# Patient Record
Sex: Female | Born: 1960 | Race: White | Hispanic: No | Marital: Married | Smoking: Never smoker
Health system: Southern US, Community
[De-identification: ages and names within clinical notes are randomized; demographics above are authoritative.]

## PROBLEM LIST (undated history)

## (undated) DIAGNOSIS — D219 Benign neoplasm of connective and other soft tissue, unspecified: Secondary | ICD-10-CM

## (undated) DIAGNOSIS — E785 Hyperlipidemia, unspecified: Secondary | ICD-10-CM

## (undated) DIAGNOSIS — I00 Rheumatic fever without heart involvement: Secondary | ICD-10-CM

## (undated) DIAGNOSIS — K219 Gastro-esophageal reflux disease without esophagitis: Secondary | ICD-10-CM

## (undated) HISTORY — DX: Benign neoplasm of connective and other soft tissue, unspecified: D21.9

## (undated) HISTORY — DX: Gastro-esophageal reflux disease without esophagitis: K21.9

## (undated) HISTORY — DX: Rheumatic fever without heart involvement: I00

## (undated) HISTORY — DX: Hyperlipidemia, unspecified: E78.5

---

## 2001-03-02 ENCOUNTER — Encounter: Admission: RE | Admit: 2001-03-02 | Discharge: 2001-03-02 | Payer: Self-pay | Admitting: Obstetrics & Gynecology

## 2001-03-02 ENCOUNTER — Encounter: Payer: Self-pay | Admitting: Obstetrics & Gynecology

## 2002-03-20 ENCOUNTER — Encounter: Payer: Self-pay | Admitting: Family Medicine

## 2002-03-20 ENCOUNTER — Encounter: Admission: RE | Admit: 2002-03-20 | Discharge: 2002-03-20 | Payer: Self-pay | Admitting: Family Medicine

## 2003-04-25 ENCOUNTER — Encounter: Admission: RE | Admit: 2003-04-25 | Discharge: 2003-04-25 | Payer: Self-pay | Admitting: Obstetrics & Gynecology

## 2004-06-02 ENCOUNTER — Encounter: Admission: RE | Admit: 2004-06-02 | Discharge: 2004-06-02 | Payer: Self-pay | Admitting: Obstetrics & Gynecology

## 2005-06-24 ENCOUNTER — Encounter: Admission: RE | Admit: 2005-06-24 | Discharge: 2005-06-24 | Payer: Self-pay | Admitting: Obstetrics & Gynecology

## 2006-07-08 ENCOUNTER — Encounter: Admission: RE | Admit: 2006-07-08 | Discharge: 2006-07-08 | Payer: Self-pay | Admitting: Obstetrics & Gynecology

## 2007-08-17 ENCOUNTER — Encounter: Admission: RE | Admit: 2007-08-17 | Discharge: 2007-08-17 | Payer: Self-pay | Admitting: Internal Medicine

## 2008-04-04 ENCOUNTER — Ambulatory Visit: Payer: Self-pay | Admitting: Gastroenterology

## 2008-07-23 ENCOUNTER — Other Ambulatory Visit: Admission: RE | Admit: 2008-07-23 | Discharge: 2008-07-23 | Payer: Self-pay | Admitting: Obstetrics and Gynecology

## 2008-10-24 ENCOUNTER — Encounter: Admission: RE | Admit: 2008-10-24 | Discharge: 2008-10-24 | Payer: Self-pay | Admitting: Internal Medicine

## 2009-08-21 ENCOUNTER — Other Ambulatory Visit: Admission: RE | Admit: 2009-08-21 | Discharge: 2009-08-21 | Payer: Self-pay | Admitting: Obstetrics and Gynecology

## 2009-10-09 ENCOUNTER — Encounter: Admission: RE | Admit: 2009-10-09 | Discharge: 2009-10-09 | Payer: Self-pay | Admitting: Obstetrics and Gynecology

## 2010-10-27 ENCOUNTER — Other Ambulatory Visit: Payer: Self-pay | Admitting: Obstetrics & Gynecology

## 2010-10-27 DIAGNOSIS — Z1231 Encounter for screening mammogram for malignant neoplasm of breast: Secondary | ICD-10-CM

## 2010-11-03 ENCOUNTER — Ambulatory Visit
Admission: RE | Admit: 2010-11-03 | Discharge: 2010-11-03 | Disposition: A | Discharge status: Home / Self Care | Payer: Self-pay | Source: Ambulatory Visit | Attending: Obstetrics & Gynecology | Admitting: Obstetrics & Gynecology

## 2010-11-03 DIAGNOSIS — Z1231 Encounter for screening mammogram for malignant neoplasm of breast: Secondary | ICD-10-CM

## 2011-02-23 ENCOUNTER — Other Ambulatory Visit: Payer: Self-pay | Admitting: Internal Medicine

## 2011-09-27 LAB — HM MAMMOGRAPHY: HM Mammogram: NORMAL

## 2011-10-21 ENCOUNTER — Other Ambulatory Visit: Payer: Self-pay | Admitting: Obstetrics & Gynecology

## 2011-10-21 DIAGNOSIS — Z1231 Encounter for screening mammogram for malignant neoplasm of breast: Secondary | ICD-10-CM

## 2011-10-26 ENCOUNTER — Ambulatory Visit
Admission: RE | Admit: 2011-10-26 | Discharge: 2011-10-26 | Disposition: A | Discharge status: Home / Self Care | Payer: 59 | Source: Ambulatory Visit | Attending: Obstetrics & Gynecology | Admitting: Obstetrics & Gynecology

## 2011-10-26 DIAGNOSIS — Z1231 Encounter for screening mammogram for malignant neoplasm of breast: Secondary | ICD-10-CM

## 2012-07-27 ENCOUNTER — Ambulatory Visit (INDEPENDENT_AMBULATORY_CARE_PROVIDER_SITE_OTHER): Payer: BC Managed Care – PPO | Admitting: Internal Medicine

## 2012-07-27 ENCOUNTER — Encounter: Payer: Self-pay | Admitting: Internal Medicine

## 2012-07-27 VITALS — BP 104/80 | HR 80 | Temp 97.9°F | Resp 16 | Ht 64.25 in | Wt 131.5 lb

## 2012-07-27 DIAGNOSIS — Z0000 Encounter for general adult medical examination without abnormal findings: Secondary | ICD-10-CM

## 2012-07-27 DIAGNOSIS — E538 Deficiency of other specified B group vitamins: Secondary | ICD-10-CM

## 2012-07-27 MED ORDER — BACITRACIN-NEOMYCIN-POLYMYXIN 400-5-5000 EX OINT: TOPICAL_OINTMENT | Freq: Two times a day (BID) | CUTANEOUS | Status: DC

## 2012-07-27 MED ORDER — IBUPROFEN 200 MG PO TABS: 200.0000 mg | ORAL_TABLET | Freq: Four times a day (QID) | ORAL | Status: AC | PRN

## 2012-07-27 MED ORDER — SYRINGE (DISPOSABLE) 3 ML MISC: 1.0000 | Status: DC

## 2012-07-27 MED ORDER — CYANOCOBALAMIN 1000 MCG/ML IJ SOLN: 1000.0000 ug | INTRAMUSCULAR | Status: DC

## 2012-07-27 MED ORDER — IBUPROFEN 200 MG PO TABS: 200.0000 mg | ORAL_TABLET | Freq: Four times a day (QID) | ORAL | Status: DC | PRN

## 2012-07-27 NOTE — Patient Instructions (Addendum)
Great to see you!   

## 2012-07-27 NOTE — Progress Notes (Signed)
Patient ID: Lauren Bell, female   DOB: 1961-03-23, 52 y.o.   MRN: [id number]  Patient Active Problem List  Diagnosis  . Dietary B12 deficiency  . Routine general medical examination at a health care facility    Subjective:  CC:   Chief Complaint  Patient presents with  . Establish Care    HPI:   Lauren Bell is a 52 y.o. female who presents as a new patient to establish primary care with the chief complaint of Reestablish care.  She was last seen 3 years ago.  Since then she has happily  remarried to an old friend and has no chronic health issues except B12 deficiency.  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    Past Medical History  Diagnosis Date  . GERD (gastroesophageal reflux disease)   . Rheumatic fever   . Urinary tract infection     History reviewed. No pertinent past surgical history.  Family History  Problem Relation Age of Onset  . Cancer Mother     Adrenal  . Alcohol abuse Father   . Hyperlipidemia Father   . Hypertension Father   . Heart disease Maternal Grandfather   . Hypertension Maternal Grandfather     History   Social History  . Marital Status: Single    Spouse Name: N/A    Number of Children: N/A  . Years of Education: N/A   Occupational History  . Not on file.   Social History Main Topics  . Smoking status: Never Smoker   . Smokeless tobacco: Never Used  . Alcohol Use: No  . Drug Use: No  . Sexually Active: Yes   Other Topics Concern  . Not on file   Social History Narrative  . No narrative on file    No Known Allergies   Review of Systems:   Patient denies headache, fevers, malaise, unintentional weight loss, skin rash, eye pain, sinus congestion and sinus pain, sore throat, dysphagia,  hemoptysis , cough, dyspnea, wheezing, chest pain, palpitations, orthopnea, edema, abdominal pain, nausea, melena, diarrhea, constipation, flank pain, dysuria, hematuria, urinary  Frequency, nocturia,  numbness, tingling, seizures,  Focal weakness, Loss of consciousness,  Tremor, insomnia, depression, anxiety, and suicidal ideation.    Objective:  BP 104/80  Pulse 80  Temp(Src) 97.9 F (36.6 C) (Oral)  Resp 16  Ht 5' 4.25" (1.632 m)  Wt 131 lb 8 oz (59.648 kg)  BMI 22.4 kg/m2  SpO2 98%  LMP 06/23/2012  General appearance: alert, cooperative and appears stated age Ears: normal TM's and external ear canals both ears Throat: lips, mucosa, and tongue normal; teeth and gums normal Neck: no adenopathy, no carotid bruit, supple, symmetrical, trachea midline and thyroid not enlarged, symmetric, no tenderness/mass/nodules Back: symmetric, no curvature. ROM normal. No CVA tenderness. Lungs: clear to auscultation bilaterally Heart: regular rate and rhythm, S1, S2 normal, no murmur, click, rub or gallop Abdomen: soft, non-tender; bowel sounds normal; no masses,  no organomegaly Pulses: 2+ and symmetric Skin: Skin color, texture, turgor normal. No rashes or lesions Lymph nodes: Cervical, supraclavicular, and axillary nodes normal.  Assessment and Plan:  Dietary B12 deficiency She is requesting refill of  B12 injectable liquid and syringes.   Routine general medical examination at a health care facility Annual comprehensive exam was done excluding breast, pelvic and PAP smear. All screenings have been addressed and she is up to date   Updated Medication List Outpatient Encounter Prescriptions as of 07/27/2012  Medication  Sig Dispense Refill  . Multiple Vitamin (MULTIVITAMIN) tablet Take 1 tablet by mouth daily.      Marland Kitchen RESVERATROL PO Take 500mg  capsule by mouth daily      . cyanocobalamin (,VITAMIN B-12,) 1000 MCG/ML injection Inject 1 mL (1,000 mcg total) into the muscle every 21 ( twenty-one) days.  12 mL  11  . ibuprofen (MOTRIN IB) 200 MG tablet Take 1 tablet (200 mg total) by mouth every 6 (six) hours as needed for pain.  300 tablet  5  . neomycin-bacitracin-polymyxin (NEOSPORIN)  ointment Apply topically every 12 (twelve) hours.  15 g  11  . nitrofurantoin (MACRODANTIN) 100 MG capsule Take 100 mg by mouth 2 (two) times daily. For 7 days.      . Syringe, Disposable, 3 ML MISC 1 Syringe by Does not apply route every 21 ( twenty-one) days.  25 each  11  . [DISCONTINUED] cyanocobalamin (,VITAMIN B-12,) 1000 MCG/ML injection INJECT 1 ML EVERY MONTH  12 mL  0  . [DISCONTINUED] ibuprofen (MOTRIN IB) 200 MG tablet Take 1 tablet (200 mg total) by mouth every 6 (six) hours as needed for pain.  300 tablet  5   No facility-administered encounter medications on file as of 07/27/2012.     Orders Placed This Encounter  Procedures  . HM MAMMOGRAPHY  . HM PAP SMEAR  . HM COLONOSCOPY    No Follow-up on file.

## 2012-07-29 ENCOUNTER — Encounter: Payer: Self-pay | Admitting: Internal Medicine

## 2012-07-29 DIAGNOSIS — Z0000 Encounter for general adult medical examination without abnormal findings: Secondary | ICD-10-CM | POA: Insufficient documentation

## 2012-07-29 NOTE — Assessment & Plan Note (Signed)
She is requesting refill of  B12 injectable liquid and syringes.

## 2012-07-29 NOTE — Assessment & Plan Note (Signed)
Annual comprehensive exam was done excluding breast, pelvic and PAP smear. All screenings have been addressed and she is up to date

## 2012-10-13 ENCOUNTER — Other Ambulatory Visit: Payer: Self-pay

## 2012-10-13 DIAGNOSIS — Z1231 Encounter for screening mammogram for malignant neoplasm of breast: Secondary | ICD-10-CM

## 2012-11-01 ENCOUNTER — Encounter: Payer: Self-pay | Admitting: Obstetrics & Gynecology

## 2012-11-01 ENCOUNTER — Ambulatory Visit: Payer: Self-pay | Admitting: Obstetrics & Gynecology

## 2012-11-03 ENCOUNTER — Encounter: Payer: Self-pay | Admitting: Obstetrics & Gynecology

## 2012-11-03 ENCOUNTER — Ambulatory Visit (INDEPENDENT_AMBULATORY_CARE_PROVIDER_SITE_OTHER): Payer: BC Managed Care – PPO | Admitting: Obstetrics & Gynecology

## 2012-11-03 VITALS — BP 116/64 | HR 64 | Resp 16 | Ht 64.75 in | Wt 130.4 lb

## 2012-11-03 DIAGNOSIS — Z01419 Encounter for gynecological examination (general) (routine) without abnormal findings: Secondary | ICD-10-CM

## 2012-11-03 MED ORDER — NITROFURANTOIN MACROCRYSTAL 100 MG PO CAPS: 100.0000 mg | ORAL_CAPSULE | Freq: Every day | ORAL | Status: DC

## 2012-11-03 NOTE — Patient Instructions (Signed)

## 2012-11-03 NOTE — Progress Notes (Signed)
52 y.o. G0P0000 SingleCaucasianF here for annual exam.  Patient doing really well.  Cycles are irregular now.  Will skip months at a time.  Most cycles light now.  She has had an occasional "normal" one as well.  Did labs a year ago with PCP.  Dr. Darrick Huntsman.  Patient's last menstrual period was 09/25/2012.          Sexually active: yes  The current method of family planning is none.  Exercising: yes  cardio Smoker:  no  Health Maintenance: Pap:  10/05/11 WNL/negative HR HPV History of abnormal Pap:  no MMG:  10/27/11 normal, scheduled for yearly next week Colonoscopy:  2010 repeat in 10 years BMD:   none TDaP:  ? Screening Labs: PCP, Hb today: PCP, Urine today: PCP   reports that she has never smoked. She has never used smokeless tobacco. She reports that she does not drink alcohol or use illicit drugs.  Past Medical History  Diagnosis Date  . GERD (gastroesophageal reflux disease)   . Rheumatic fever   . Urinary tract infection   . Fibroids     h/o     History reviewed. No pertinent past surgical history.  Current Outpatient Prescriptions  Medication Sig Dispense Refill  . Calcium-Vitamin D-Vitamin K (CHEWABLE CALCIUM PO) Take by mouth.      . cyanocobalamin (,VITAMIN B-12,) 1000 MCG/ML injection Inject 1 mL (1,000 mcg total) into the muscle every 21 ( twenty-one) days.  12 mL  11  . ibuprofen (MOTRIN IB) 200 MG tablet Take 1 tablet (200 mg total) by mouth every 6 (six) hours as needed for pain.  300 tablet  5  . Multiple Vitamin (MULTIVITAMIN) tablet Take 1 tablet by mouth daily.      Marland Kitchen neomycin-bacitracin-polymyxin (NEOSPORIN) ointment Apply topically every 12 (twelve) hours.  15 g  11  . nitrofurantoin (MACRODANTIN) 100 MG capsule Take 100 mg by mouth 2 (two) times daily. For 7 days.      . RESVERATROL PO Take 500mg  capsule by mouth daily      . Syringe, Disposable, 3 ML MISC 1 Syringe by Does not apply route every 21 ( twenty-one) days.  25 each  11   No current  facility-administered medications for this visit.    Family History  Problem Relation Age of Onset  . Cancer Mother     Adrenal  . Alcohol abuse Father   . Hyperlipidemia Father   . Hypertension Father   . Heart disease Maternal Grandfather   . Hypertension Maternal Grandfather     ROS:  Pertinent items are noted in HPI.  Otherwise, a comprehensive ROS was negative.  Exam:   BP 116/64  Pulse 64  Resp 16  Ht 5' 4.75" (1.645 m)  Wt 130 lb 6.4 oz (59.149 kg)  BMI 21.86 kg/m2  LMP 09/25/2012  Weight change: +3  Height: 5' 4.75" (164.5 cm)  Ht Readings from Last 3 Encounters:  11/03/12 5' 4.75" (1.645 m)  07/27/12 5' 4.25" (1.632 m)    General appearance: alert, cooperative and appears stated age Head: Normocephalic, without obvious abnormality, atraumatic Neck: no adenopathy, supple, symmetrical, trachea midline and thyroid normal to inspection and palpation Lungs: clear to auscultation bilaterally Breasts: normal appearance, no masses or tenderness Heart: regular rate and rhythm Abdomen: soft, non-tender; bowel sounds normal; no masses,  no organomegaly Extremities: extremities normal, atraumatic, no cyanosis or edema Skin: Skin color, texture, turgor normal. No rashes or lesions Lymph nodes: Cervical, supraclavicular, and axillary nodes  normal. No abnormal inguinal nodes palpated Neurologic: Grossly normal   Pelvic: External genitalia:  no lesions              Urethra:  normal appearing urethra with no masses, tenderness or lesions              Bartholins and Skenes: normal                 Vagina: normal appearing vagina with normal color and discharge, no lesions              Cervix: no lesions              Pap taken: no Bimanual Exam:  Uterus:  enlarged, 8 weeks size and anteverted, globular              Adnexa: normal adnexa and no mass, fullness, tenderness               Rectovaginal: Confirms               Anus:  normal sphincter tone, no lesions  A:  Well  Woman with normal exam H/o fibroids H/O recurrent UTIs  P:   Mammogram yearly.  Will do 3D due to dense breasts Neg Pap with Neg HR HPV one year ago.  No pap today. Labs yearly with Dr. Darrick Huntsman Rx for [facility] #90/3RF given return annually or prn  An After Visit Summary was printed and given to the patient.

## 2012-11-10 ENCOUNTER — Ambulatory Visit
Admission: RE | Admit: 2012-11-10 | Discharge: 2012-11-10 | Disposition: A | Discharge status: Home / Self Care | Payer: BC Managed Care – PPO | Source: Ambulatory Visit

## 2012-11-10 DIAGNOSIS — Z1231 Encounter for screening mammogram for malignant neoplasm of breast: Secondary | ICD-10-CM

## 2013-03-22 ENCOUNTER — Other Ambulatory Visit: Payer: Self-pay

## 2013-07-13 ENCOUNTER — Other Ambulatory Visit: Payer: Self-pay | Admitting: *Deleted

## 2013-07-13 MED ORDER — CYANOCOBALAMIN 1000 MCG/ML IJ SOLN: 1000.0000 ug | INTRAMUSCULAR | Status: DC

## 2013-12-28 ENCOUNTER — Ambulatory Visit (INDEPENDENT_AMBULATORY_CARE_PROVIDER_SITE_OTHER): Payer: BC Managed Care – PPO | Admitting: Obstetrics & Gynecology

## 2013-12-28 ENCOUNTER — Encounter: Payer: Self-pay | Admitting: Obstetrics & Gynecology

## 2013-12-28 VITALS — BP 122/72 | HR 64 | Resp 16 | Ht 64.25 in | Wt 131.6 lb

## 2013-12-28 DIAGNOSIS — Z124 Encounter for screening for malignant neoplasm of cervix: Secondary | ICD-10-CM

## 2013-12-28 DIAGNOSIS — Z01419 Encounter for gynecological examination (general) (routine) without abnormal findings: Secondary | ICD-10-CM

## 2013-12-28 DIAGNOSIS — Z23 Encounter for immunization: Secondary | ICD-10-CM

## 2013-12-28 MED ORDER — NITROFURANTOIN MACROCRYSTAL 100 MG PO CAPS: 100.0000 mg | ORAL_CAPSULE | Freq: Every day | ORAL | Status: DC

## 2013-12-28 NOTE — Progress Notes (Signed)
53 y.o. G0P0000 Married CaucasianF here for annual exam.  Cycles are infrequent.  Had three this year--sept, march, and April.  None were heavy.  No abnormal spotting.    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    Patient's last menstrual period was 09/08/2013.          Sexually active: Yes.    The current method of family planning is none.    Exercising: Yes.    [place] Smoker:  no  Health Maintenance: Pap:  10/05/11 WNL/negative HR HPV History of abnormal Pap:  no MMG:  11/10/12 3D-normal.  Aware it is due. Colonoscopy:  2010-repeat in 10 years BMD:   none TDaP:  ? Screening Labs: PCP, Hb today: PCP, Urine today: PCP   reports that she has never smoked. She has never used smokeless tobacco. She reports that she does not drink alcohol or use illicit drugs.  Past Medical History  Diagnosis Date  . GERD (gastroesophageal reflux disease)   . Rheumatic fever   . Fibroids     h/o     History reviewed. No pertinent past surgical history.  Current Outpatient Prescriptions  Medication Sig Dispense Refill  . Multiple Vitamin (MULTIVITAMIN) tablet Take 1 tablet by mouth daily.      Marland Kitchen RESVERATROL PO Take 500mg  capsule by mouth daily      . Calcium-Vitamin D-Vitamin K (CHEWABLE CALCIUM PO) Take by mouth.      . cyanocobalamin (,VITAMIN B-12,) 1000 MCG/ML injection Inject 1 mL (1,000 mcg total) into the muscle every 21 ( twenty-one) days.  1 mL  1  . ibuprofen (MOTRIN IB) 200 MG tablet Take 1 tablet (200 mg total) by mouth every 6 (six) hours as needed for pain.  300 tablet  5  . nitrofurantoin (MACRODANTIN) 100 MG capsule Take 1 capsule (100 mg total) by mouth daily. For 7 days.  90 capsule  3  . Syringe, Disposable, 3 ML MISC 1 Syringe by Does not apply route every 21 ( twenty-one) days.  25 each  11   No current facility-administered medications for this visit.    Family History  Problem Relation Age of Onset  . Cancer Mother     Adrenal  . Alcohol abuse  Father   . Hyperlipidemia Father   . Hypertension Father   . Heart disease Maternal Grandfather   . Hypertension Maternal Grandfather     ROS:  Pertinent items are noted in HPI.  Otherwise, a comprehensive ROS was negative.  Exam:   BP 122/72  Pulse 64  Resp 16  Ht 5' 4.25" (1.632 m)  Wt 131 lb 9.6 oz (59.693 kg)  BMI 22.41 kg/m2  LMP 09/08/2013  Height: 5' 4.25" (163.2 cm)  Ht Readings from Last 3 Encounters:  12/28/13 5' 4.25" (1.632 m)  11/03/12 5' 4.75" (1.645 m)  07/27/12 5' 4.25" (1.632 m)    General appearance: alert, cooperative and appears stated age Head: Normocephalic, without obvious abnormality, atraumatic Neck: no adenopathy, supple, symmetrical, trachea midline and thyroid normal to inspection and palpation Lungs: clear to auscultation bilaterally Breasts: normal appearance, no masses or tenderness Heart: regular rate and rhythm Abdomen: soft, non-tender; bowel sounds normal; no masses,  no organomegaly Extremities: extremities normal, atraumatic, no cyanosis or edema Skin: Skin color, texture, turgor normal. No rashes or lesions Lymph nodes: Cervical, supraclavicular, and axillary nodes normal. No abnormal inguinal nodes palpated Neurologic: Grossly normal   Pelvic: External genitalia:  no lesions  Urethra:  normal appearing urethra with no masses, tenderness or lesions              Bartholins and Skenes: normal                 Vagina: normal appearing vagina with normal color and discharge, no lesions              Cervix: no lesions              Pap taken: Yes.   Bimanual Exam:  Uterus:  normal size, contour, position, consistency, mobility, non-tender              Adnexa: normal adnexa and no mass, fullness, tenderness               Rectovaginal: Confirms               Anus:  normal sphincter tone, no lesions  A:  Well Woman with normal exam  H/o fibroids last PUS 6/12 with 3.2cm fibroid H/O recurrent UTIs   P: Mammogram yearly. Will  do 3D due to dense breasts  Neg Pap with Neg HR HPV one year ago.  Pap today.  Labs yearly with Dr. Derrel Nip.  Hasn't seen her this year.  Goes about every other year.  May need RF which I told her I can do.  1023mcg/ml every 21-28 days. Rx for Macrobid #30/3RF given  tdap given today. return annually or prn   An After Visit Summary was printed and given to the patient.

## 2014-01-02 LAB — IPS PAP TEST WITH REFLEX TO HPV

## 2014-03-28 ENCOUNTER — Other Ambulatory Visit: Payer: Self-pay

## 2014-03-28 DIAGNOSIS — Z1231 Encounter for screening mammogram for malignant neoplasm of breast: Secondary | ICD-10-CM

## 2014-04-18 ENCOUNTER — Ambulatory Visit
Admission: RE | Admit: 2014-04-18 | Discharge: 2014-04-18 | Disposition: A | Discharge status: Home / Self Care | Payer: BC Managed Care – PPO | Source: Ambulatory Visit

## 2014-04-18 DIAGNOSIS — Z1231 Encounter for screening mammogram for malignant neoplasm of breast: Secondary | ICD-10-CM

## 2014-05-15 ENCOUNTER — Encounter: Payer: Self-pay | Admitting: Internal Medicine

## 2014-05-15 MED ORDER — CYANOCOBALAMIN 1000 MCG/ML IJ SOLN: 1000.0000 ug | INTRAMUSCULAR | Status: DC

## 2014-05-15 NOTE — Telephone Encounter (Signed)
See mychart, Ok refill, last visit 07/27/12

## 2014-09-18 ENCOUNTER — Encounter: Payer: Self-pay | Admitting: Internal Medicine

## 2014-12-10 ENCOUNTER — Ambulatory Visit (INDEPENDENT_AMBULATORY_CARE_PROVIDER_SITE_OTHER): Payer: Managed Care, Other (non HMO) | Admitting: Internal Medicine

## 2014-12-10 ENCOUNTER — Encounter: Payer: Self-pay | Admitting: Internal Medicine

## 2014-12-10 VITALS — BP 136/74 | HR 71 | Temp 98.0°F | Resp 14 | Ht 64.0 in | Wt 132.5 lb

## 2014-12-10 DIAGNOSIS — R5383 Other fatigue: Secondary | ICD-10-CM | POA: Diagnosis not present

## 2014-12-10 DIAGNOSIS — Z0000 Encounter for general adult medical examination without abnormal findings: Secondary | ICD-10-CM

## 2014-12-10 DIAGNOSIS — E785 Hyperlipidemia, unspecified: Secondary | ICD-10-CM

## 2014-12-10 DIAGNOSIS — E559 Vitamin D deficiency, unspecified: Secondary | ICD-10-CM

## 2014-12-10 DIAGNOSIS — Z1159 Encounter for screening for other viral diseases: Secondary | ICD-10-CM

## 2014-12-10 DIAGNOSIS — Z1239 Encounter for other screening for malignant neoplasm of breast: Secondary | ICD-10-CM

## 2014-12-10 MED ORDER — CYANOCOBALAMIN 1000 MCG/ML IJ SOLN: 1000.0000 ug | INTRAMUSCULAR | Status: DC

## 2014-12-10 MED ORDER — SYRINGE (DISPOSABLE) 3 ML MISC: 1.0000 | Status: DC

## 2014-12-10 NOTE — Patient Instructions (Signed)

## 2014-12-10 NOTE — Progress Notes (Signed)
Pre-visit discussion using our clinic review tool. No additional management support is needed unless otherwise documented below in the visit note.  

## 2014-12-11 LAB — CBC WITH DIFFERENTIAL/PLATELET
BASOS ABS: 0 10*3/uL (ref 0.0–0.1)
BASOS PCT: 0.2 % (ref 0.0–3.0)
Eosinophils Absolute: 0.1 10*3/uL (ref 0.0–0.7)
Eosinophils Relative: 1.2 % (ref 0.0–5.0)
HEMATOCRIT: 42.3 % (ref 36.0–46.0)
Hemoglobin: 14.6 g/dL (ref 12.0–15.0)
LYMPHS PCT: 30.7 % (ref 12.0–46.0)
Lymphs Abs: 2.1 10*3/uL (ref 0.7–4.0)
MCHC: 34.5 g/dL (ref 30.0–36.0)
MCV: 90.8 fl (ref 78.0–100.0)
Monocytes Absolute: 0.5 10*3/uL (ref 0.1–1.0)
Monocytes Relative: 7.8 % (ref 3.0–12.0)
NEUTROS PCT: 60.1 % (ref 43.0–77.0)
Neutro Abs: 4.2 10*3/uL (ref 1.4–7.7)
PLATELETS: 245 10*3/uL (ref 150.0–400.0)
RBC: 4.66 Mil/uL (ref 3.87–5.11)
RDW: 13.3 % (ref 11.5–15.5)
WBC: 6.9 10*3/uL (ref 4.0–10.5)

## 2014-12-11 LAB — COMPREHENSIVE METABOLIC PANEL
ALBUMIN: 4.7 g/dL (ref 3.5–5.2)
ALT: 15 U/L (ref 0–35)
AST: 20 U/L (ref 0–37)
Alkaline Phosphatase: 74 U/L (ref 39–117)
BUN: 8 mg/dL (ref 6–23)
CALCIUM: 9.8 mg/dL (ref 8.4–10.5)
CHLORIDE: 104 meq/L (ref 96–112)
CO2: 30 mEq/L (ref 19–32)
Creatinine, Ser: 0.73 mg/dL (ref 0.40–1.20)
GFR: 88.17 mL/min (ref >60.00)
GLUCOSE: 70 mg/dL (ref 70–99)
Potassium: 4.6 mEq/L (ref 3.5–5.1)
Sodium: 142 mEq/L (ref 135–145)
Total Bilirubin: 0.4 mg/dL (ref 0.2–1.2)
Total Protein: 7.1 g/dL (ref 6.0–8.3)

## 2014-12-11 LAB — TSH: TSH: 1.04 u[IU]/mL (ref 0.35–4.50)

## 2014-12-11 LAB — VITAMIN D 25 HYDROXY (VIT D DEFICIENCY, FRACTURES): VITD: 25.89 ng/mL — AB (ref 30.00–100.00)

## 2014-12-11 LAB — LIPID PANEL
CHOL/HDL RATIO: 3
Cholesterol: 227 mg/dL — ABNORMAL HIGH (ref 0–200)
HDL: 83.8 mg/dL (ref >39.00)
LDL CALC: 116 mg/dL — AB (ref 0–99)
NonHDL: 143.2
Triglycerides: 135 mg/dL (ref 0.0–149.0)
VLDL: 27 mg/dL (ref 0.0–40.0)

## 2014-12-11 LAB — HEPATITIS C ANTIBODY: HCV AB: NEGATIVE

## 2014-12-12 ENCOUNTER — Encounter: Payer: Self-pay | Admitting: Internal Medicine

## 2014-12-12 DIAGNOSIS — E559 Vitamin D deficiency, unspecified: Secondary | ICD-10-CM | POA: Insufficient documentation

## 2014-12-12 DIAGNOSIS — E785 Hyperlipidemia, unspecified: Secondary | ICD-10-CM | POA: Insufficient documentation

## 2014-12-12 NOTE — Assessment & Plan Note (Signed)
Annual wellness  exam was done as well as a comprehensive physical exam  .  During the course of the visit the patient was educated and counseled about appropriate screening and preventive services and screenings were brought up to date for cervical and breast cancer .  She has had fasting labs to provide samples for diabetes screening and lipid analysis with projected  10 year  risk for CAD. nutrition counseling, skin cancer screening has been recommended, along with review of the age appropriate recommended immunizations.  Printed recommendations for health maintenance screenings was given.

## 2014-12-12 NOTE — Progress Notes (Signed)
Patient ID: Lauren Bell, female    DOB: 1960/09/16  Age: 54 y.o. MRN: [id number]  The patient is here for annual  wellness examination and management of other chronic and acute problems.   The risk factors are reflected in the social history.  The roster of all physicians providing medical care to patient - is listed in the Snapshot section of the chart.  Activities of daily living:  The patient is 100% independent in all ADLs: dressing, toileting, feeding as well as independent mobility  Home safety : The patient has smoke detectors in the home. They wear seatbelts.  There are no firearms at home. There is no violence in the home.   There is no risks for hepatitis, STDs or HIV. There is no   history of blood transfusion. They have no travel history to infectious disease endemic areas of the world.  The patient has seen their dentist in the last six month. They have seen their eye doctor in the last year. They admit to slight hearing difficulty with regard to whispered voices and some television programs.  They have deferred audiologic testing in the last year.  They do not  have excessive sun exposure. Discussed the need for sun protection: hats, long sleeves and use of sunscreen if there is significant sun exposure.   Diet: the importance of a healthy diet is discussed. They do have a healthy diet.  The benefits of regular aerobic exercise were discussed. She exercises 5 gtimes per week a minimum of 30 minutes.   Depression screen: there are no signs or vegative symptoms of depression- irritability, change in appetite, anhedonia, sadness/tearfullness.  Cognitive assessment: the patient manages all their financial and personal affairs and is actively engaged. They could relate day,date,year and events; recalled 2/3 objects at 3 minutes; performed clock-face test normally.  The following portions of the patient's history were reviewed and updated as appropriate: allergies, current  medications, past family history, past medical history,  past surgical history, past social history  and problem list.  Visual acuity was not assessed per patient preference since she has regular follow up with her ophthalmologist. Hearing and body mass index were assessed and reviewed.   During the course of the visit the patient was educated and counseled about appropriate screening and preventive services including : fall prevention , diabetes screening, nutrition counseling, colorectal cancer screening, and recommended immunizations.    CC: The primary encounter diagnosis was Breast cancer screening. Diagnoses of Other fatigue, Hyperlipidemia, Need for hepatitis C screening test, Vitamin D deficiency, Hyperlipidemia LDL goal <160, and Routine general medical examination at a health care facility were also pertinent to this visit.  History Lauren Bell has a past medical history of GERD (gastroesophageal reflux disease); Rheumatic fever; and Fibroids.   She has no past surgical history on file.   Her family history includes Alcohol abuse in her father; Cancer in her mother; Heart disease in her maternal grandfather; Hyperlipidemia in her father; Hypertension in her father and maternal grandfather.She reports that she has never smoked. She has never used smokeless tobacco. She reports that she does not drink alcohol or use illicit drugs.  Outpatient Prescriptions Prior to Visit  Medication Sig Dispense Refill  . ibuprofen (MOTRIN IB) 200 MG tablet Take 1 tablet (200 mg total) by mouth every 6 (six) hours as needed for pain. 300 tablet 5  . RESVERATROL PO Take 500mg  capsule by mouth daily    . cyanocobalamin (,VITAMIN B-12,) 1000 MCG/ML injection Inject 1  mL (1,000 mcg total) into the muscle every 7 (seven) days. 10 mL 3  . Syringe, Disposable, 3 ML MISC 1 Syringe by Does not apply route every 21 ( twenty-one) days. 25 each 11  . Calcium-Vitamin D-Vitamin K (CHEWABLE CALCIUM PO) Take by mouth.     . Multiple Vitamin (MULTIVITAMIN) tablet Take 1 tablet by mouth daily.    . nitrofurantoin (MACRODANTIN) 100 MG capsule Take 1 capsule (100 mg total) by mouth daily. For 7 days.  Also may use for prophylaxis. (Patient not taking: Reported on 12/10/2014) 30 capsule 12   No facility-administered medications prior to visit.    Review of Systems   Patient denies headache, fevers, malaise, unintentional weight loss, skin rash, eye pain, sinus congestion and sinus pain, sore throat, dysphagia,  hemoptysis , cough, dyspnea, wheezing, chest pain, palpitations, orthopnea, edema, abdominal pain, nausea, melena, diarrhea, constipation, flank pain, dysuria, hematuria, urinary  Frequency, nocturia, numbness, tingling, seizures,  Focal weakness, Loss of consciousness,  Tremor, insomnia, depression, anxiety, and suicidal ideation.     Objective:  BP 136/74 mmHg  Pulse 71  Temp(Src) 98 F (36.7 C) (Oral)  Resp 14  Ht 5\' 4"  (1.626 m)  Wt 132 lb 8 oz (60.102 kg)  BMI 22.73 kg/m2  SpO2 97%  LMP 09/09/2014  Physical Exam   General appearance: alert, cooperative and appears stated age Head: Normocephalic, without obvious abnormality, atraumatic Eyes: conjunctivae/corneas clear. PERRL, EOM's intact. Fundi benign. Ears: normal TM's and external ear canals both ears Nose: Nares normal. Septum midline. Mucosa normal. No drainage or sinus tenderness. Throat: lips, mucosa, and tongue normal; teeth and gums normal Neck: no adenopathy, no carotid bruit, no JVD, supple, symmetrical, trachea midline and thyroid not enlarged, symmetric, no tenderness/mass/nodules Lungs: clear to auscultation bilaterally Breasts: normal appearance, no masses or tenderness Heart: regular rate and rhythm, S1, S2 normal, no murmur, click, rub or gallop Abdomen: soft, non-tender; bowel sounds normal; no masses,  no organomegaly Extremities: extremities normal, atraumatic, no cyanosis or edema Pulses: 2+ and symmetric Skin: Skin  color, texture, turgor normal. No rashes or lesions Neurologic: Alert and oriented X 3, normal strength and tone. Normal symmetric reflexes. Normal coordination and gait.    Assessment & Plan:   Problem List Items Addressed This Visit      Unprioritized   Routine general medical examination at a health care facility    Annual wellness  exam was done as well as a comprehensive physical exam  .  During the course of the visit the patient was educated and counseled about appropriate screening and preventive services and screenings were brought up to date for cervical and breast cancer .  She has had fasting labs to provide samples for diabetes screening and lipid analysis with projected  10 year  risk for CAD. nutrition counseling, skin cancer screening has been recommended, along with review of the age appropriate recommended immunizations.  Printed recommendations for health maintenance screenings was given.        Hyperlipidemia LDL goal <160    Based on current lipid profile, the risk of clinically significant CAD is 5.4 over the next 10 years, using the Framingham risk calculator. The [employer] recommends starting patients aged 10 or higher on moderate intensity statin therapy for LDL between 70-189 and 10 yr risk of CAD > 7.5% ;  and high intensity therapy for anyone with LDL > 190.  There is no indication for statin therapy at this time.  Vitamin D deficiency    Mild , level was 25.  Advised to increase dose to 2000 IUs daily       Relevant Orders   Vit D  25 hydroxy (rtn osteoporosis monitoring) (Completed)    Other Visit Diagnoses    Breast cancer screening    -  Primary    Relevant Orders    MM DIGITAL SCREENING BILATERAL    Other fatigue        Relevant Orders    CBC with Differential/Platelet (Completed)    Comprehensive metabolic panel (Completed)    TSH (Completed)    Hyperlipidemia        Relevant Orders    Lipid panel (Completed)    Need for  hepatitis C screening test        Relevant Orders    Hepatitis C antibody (Completed)       I am having Ms. Lauren Bell maintain her multivitamin, RESVERATROL PO, ibuprofen, Calcium-Vitamin D-Vitamin K (CHEWABLE CALCIUM PO), nitrofurantoin, Probiotic Product (PROBIOTIC & ACIDOPHILUS EX ST PO), cyanocobalamin, and Syringe (Disposable).  Meds ordered this encounter  Medications  . Probiotic Product (PROBIOTIC & ACIDOPHILUS EX ST PO)    Sig: Take 1 capsule by mouth daily.  Marland Kitchen DISCONTD: cyanocobalamin (,VITAMIN B-12,) 1000 MCG/ML injection    Sig: Inject 1 mL (1,000 mcg total) into the muscle every 7 (seven) days.    Dispense:  10 mL    Refill:  3  . DISCONTD: Syringe, Disposable, 3 ML MISC    Sig: 1 Syringe by Does not apply route every 21 ( twenty-one) days.    Dispense:  25 each    Refill:  11  . cyanocobalamin (,VITAMIN B-12,) 1000 MCG/ML injection    Sig: Inject 1 mL (1,000 mcg total) into the muscle every 7 (seven) days.    Dispense:  10 mL    Refill:  3  . Syringe, Disposable, 3 ML MISC    Sig: 1 Syringe by Does not apply route every 21 ( twenty-one) days.    Dispense:  25 each    Refill:  11    Medications Discontinued During This Encounter  Medication Reason  . cyanocobalamin (,VITAMIN B-12,) 1000 MCG/ML injection Reorder  . Syringe, Disposable, 3 ML MISC Reorder  . cyanocobalamin (,VITAMIN B-12,) 1000 MCG/ML injection Reorder  . Syringe, Disposable, 3 ML MISC Reorder    Follow-up: No Follow-up on file.   Crecencio Mc, MD

## 2014-12-12 NOTE — Assessment & Plan Note (Signed)
Based on current lipid profile, the risk of clinically significant CAD is 5.4 over the next 10 years, using the Framingham risk calculator. The [employer] recommends starting patients aged 54 or higher on moderate intensity statin therapy for LDL between 70-189 and 10 yr risk of CAD > 7.5% ;  and high intensity therapy for anyone with LDL > 190.  There is no indication for statin therapy at this time.

## 2014-12-12 NOTE — Assessment & Plan Note (Signed)
Mild , level was 25.  Advised to increase dose to 2000 IUs daily

## 2015-02-06 ENCOUNTER — Ambulatory Visit: Payer: BC Managed Care – PPO | Admitting: Obstetrics & Gynecology

## 2015-02-19 ENCOUNTER — Ambulatory Visit (INDEPENDENT_AMBULATORY_CARE_PROVIDER_SITE_OTHER): Payer: Managed Care, Other (non HMO) | Admitting: Obstetrics and Gynecology

## 2015-02-19 ENCOUNTER — Encounter: Payer: Self-pay | Admitting: Obstetrics and Gynecology

## 2015-02-19 VITALS — BP 112/66 | HR 72 | Resp 16 | Ht 64.25 in | Wt 133.0 lb

## 2015-02-19 DIAGNOSIS — Z87440 Personal history of urinary (tract) infections: Secondary | ICD-10-CM | POA: Diagnosis not present

## 2015-02-19 DIAGNOSIS — Z01419 Encounter for gynecological examination (general) (routine) without abnormal findings: Secondary | ICD-10-CM | POA: Diagnosis not present

## 2015-02-19 MED ORDER — NITROFURANTOIN MACROCRYSTAL 100 MG PO CAPS: ORAL_CAPSULE | ORAL | Status: DC

## 2015-02-19 NOTE — Patient Instructions (Signed)

## 2015-02-19 NOTE — Progress Notes (Signed)
Patient ID: Lauren Bell, female   DOB: May 09, 1961, 54 y.o.   MRN: [id number] 54 y.o. G0P0000 SingleCaucasianF here for annual exam.  The patient has a risk of recurrent UTI's, for a while she was taking Macrobid with intercourse, now using intermittently with intercourse. LMP in 5/16, over the last 3 years, irregular, 3 in the prior year. She is having hot flashes, night sweats and vaginal dryness. Symptoms are tolerable. Uses lubricant with intercourse, no pain. No spotting since 5/16.     Patient's last menstrual period was 10/11/2014 (exact date).          Sexually active: Yes.    The current method of family planning is none.    Exercising: Yes.    kayaking, paddle boarding, walking Smoker:  no  Health Maintenance: Pap:  12/28/13, Negative History of abnormal Pap:  no MMG:  04/18/14, Bi-Rads 1:  Negative Colonoscopy:  07/27/08, Normal diagnostic at age 53 BMD:   Never  TDaP:  12/28/13 Gardasil: N/A   reports that she has never smoked. She has never used smokeless tobacco. She reports that she does not drink alcohol or use illicit drugs.  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  Past Medical History  Diagnosis Date  . GERD (gastroesophageal reflux disease)   . Rheumatic fever   . Fibroids     h/o     History reviewed. No pertinent past surgical history.  Current Outpatient Prescriptions  Medication Sig Dispense Refill  . Calcium-Vitamin D-Vitamin K (CHEWABLE CALCIUM PO) Take by mouth.    . cyanocobalamin (,VITAMIN B-12,) 1000 MCG/ML injection Inject 1 mL (1,000 mcg total) into the muscle every 7 (seven) days. 10 mL 3  . ibuprofen (MOTRIN IB) 200 MG tablet Take 1 tablet (200 mg total) by mouth every 6 (six) hours as needed for pain. 300 tablet 5  . Multiple Vitamin (MULTIVITAMIN) tablet Take 1 tablet by mouth daily.    . nitrofurantoin (MACRODANTIN) 100 MG capsule Take 1 capsule (100 mg total) by mouth daily. For 7 days.  Also may use for prophylaxis. 30 capsule 12  .  Probiotic Product (PROBIOTIC & ACIDOPHILUS EX ST PO) Take 1 capsule by mouth daily.    Marland Kitchen RESVERATROL PO Take 500mg  capsule by mouth daily    . Syringe, Disposable, 3 ML MISC 1 Syringe by Does not apply route every 21 ( twenty-one) days. 25 each 11   No current facility-administered medications for this visit.    Family History  Problem Relation Age of Onset  . Cancer Mother     Adrenal  . Alcohol abuse Father   . Hyperlipidemia Father   . Hypertension Father   . Heart disease Maternal Grandfather   . Hypertension Maternal Grandfather     Review of Systems  Exam:   BP 112/66 mmHg  Pulse 72  Resp 16  Ht 5' 4.25" (1.632 m)  Wt 133 lb (60.328 kg)  BMI 22.65 kg/m2  LMP 10/11/2014 (Exact Date)  Weight change: @WEIGHTCHANGE @ Height:   Height: 5' 4.25" (163.2 cm)  Ht Readings from Last 3 Encounters:  02/19/15 5' 4.25" (1.632 m)  12/10/14 5\' 4"  (1.626 m)  12/28/13 5' 4.25" (1.632 m)    General appearance: alert, cooperative and appears stated age Head: Normocephalic, without obvious abnormality, atraumatic Neck: no adenopathy, supple, symmetrical, trachea midline and thyroid normal to inspection and palpation Lungs: clear to auscultation bilaterally Breasts: normal appearance, no masses or tenderness Heart: regular rate and rhythm Abdomen: soft, non-tender;  bowel sounds normal; no masses,  no organomegaly Extremities: extremities normal, atraumatic, no cyanosis or edema Skin: Skin color, texture, turgor normal. No rashes or lesions Lymph nodes: Cervical, supraclavicular, and axillary nodes normal. No abnormal inguinal nodes palpated Neurologic: Grossly normal   Pelvic: External genitalia:  no lesions              Urethra:  normal appearing urethra with no masses, tenderness or lesions              Bartholins and Skenes: normal                 Vagina: normal appearing vagina with normal color and discharge, no lesions. Mild atrophy              Cervix: no lesions                Bimanual Exam:  Uterus:  normal size, contour, position, consistency, mobility, non-tender and anteverted              Adnexa: no mass, fullness, tenderness               Rectovaginal: Confirms               Anus:  normal sphincter tone, no lesions  Chaperone was present for exam.  A:  Well Woman with normal exam  H/O UTI's with intercourse, uses prophylaxis  P:    Labs with primary  Pap not due  Mammogram due in 12/16  Colonoscopy in 2020  Discussed breast self exam  Calcium and vit D  Macrobid prn intercourse

## 2015-06-18 ENCOUNTER — Ambulatory Visit
Admission: RE | Admit: 2015-06-18 | Discharge: 2015-06-18 | Disposition: A | Discharge status: Home / Self Care | Payer: Managed Care, Other (non HMO) | Source: Ambulatory Visit | Attending: Internal Medicine | Admitting: Internal Medicine

## 2015-06-18 DIAGNOSIS — Z1239 Encounter for other screening for malignant neoplasm of breast: Secondary | ICD-10-CM

## 2016-03-05 ENCOUNTER — Encounter: Payer: Self-pay | Admitting: Obstetrics & Gynecology

## 2016-03-12 ENCOUNTER — Telehealth: Payer: Self-pay | Admitting: *Deleted

## 2016-03-12 NOTE — Telephone Encounter (Signed)
Spoke with Coralyn Mark at [facility]. Provided fax [phone]. Advised will send orders via fax.

## 2016-03-12 NOTE — Telephone Encounter (Signed)
Order faxed to [facility]. Will notify patient via [place].   Routing to provider for final review. Patient is agreeable to disposition. Will close encounter.

## 2016-03-12 NOTE — Telephone Encounter (Signed)
Called [facility] [phone], Left message to call Sharee Pimple at [facility] [phone].

## 2016-03-12 NOTE — Telephone Encounter (Signed)
Spoke with patient. Patient provided RN with telephone # for [facility] [phone]. Advised patient, RN would call to see if fax # available for faxing order and return call to patient. Patient agreeable.

## 2016-03-12 NOTE — Telephone Encounter (Signed)
Return call to patient. Advised no answer at [facility], left message to return call. Advised patient if no return call from [facility] today, will send order out via standard mail today 03/12/16. Patient verbalizes understanding and is agreeable.

## 2016-03-23 ENCOUNTER — Telehealth: Payer: Self-pay | Admitting: Obstetrics & Gynecology

## 2016-03-23 ENCOUNTER — Ambulatory Visit: Payer: Self-pay | Admitting: Obstetrics & Gynecology

## 2016-03-23 NOTE — Telephone Encounter (Signed)
Left patient a message to call back to reschedule an appointment for her AEX today that was cancelled by the provider.

## 2016-03-23 NOTE — Progress Notes (Deleted)
55 y.o. G0P0000 SingleCaucasianF here for annual exam.    No LMP recorded.          Sexually active: {yes no:314532}  The current method of family planning is none.    Exercising: {yes no:314532}  {types:19826} Smoker:  no  Health Maintenance: Pap:  12/28/13 negative  History of abnormal Pap:  no MMG:  06/19/15 BIRADS 1 negative  Colonoscopy:  07/27/08 normal  BMD:   never TDaP:  12/28/13 Pneumonia vaccine(s):  *** Zostavax:   *** Hep C testing: *** Screening Labs: ***, Hb today: ***, Urine today: ***   reports that she has never smoked. She has never used smokeless tobacco. She reports that she does not drink alcohol or use drugs.  Past Medical History:  Diagnosis Date  . Fibroids    h/o   . GERD (gastroesophageal reflux disease)   . Rheumatic fever     No past surgical history on file.  Current Outpatient Prescriptions  Medication Sig Dispense Refill  . Calcium-Vitamin D-Vitamin K (CHEWABLE CALCIUM PO) Take by mouth.    . cyanocobalamin (,VITAMIN B-12,) 1000 MCG/ML injection Inject 1 mL (1,000 mcg total) into the muscle every 7 (seven) days. 10 mL 3  . ibuprofen (MOTRIN IB) 200 MG tablet Take 1 tablet (200 mg total) by mouth every 6 (six) hours as needed for pain. 300 tablet 5  . Multiple Vitamin (MULTIVITAMIN) tablet Take 1 tablet by mouth daily.    . nitrofurantoin (MACRODANTIN) 100 MG capsule 1 tab po with intercourse 30 capsule 3  . Probiotic Product (PROBIOTIC & ACIDOPHILUS EX ST PO) Take 1 capsule by mouth daily.    Marland Kitchen RESVERATROL PO Take 500mg  capsule by mouth daily    . Syringe, Disposable, 3 ML MISC 1 Syringe by Does not apply route every 21 ( twenty-one) days. 25 each 11   No current facility-administered medications for this visit.     Family History  Problem Relation Age of Onset  . Cancer Mother     Adrenal  . Alcohol abuse Father   . Hyperlipidemia Father   . Hypertension Father   . Heart disease Maternal Grandfather   . Hypertension Maternal  Grandfather     ROS:  Pertinent items are noted in HPI.  Otherwise, a comprehensive ROS was negative.  Exam:   There were no vitals taken for this visit.  Weight change: @WEIGHTCHANGE @ Height:      Ht Readings from Last 3 Encounters:  02/19/15 5' 4.25" (1.632 m)  12/10/14 5\' 4"  (1.626 m)  12/28/13 5' 4.25" (1.632 m)    General appearance: alert, cooperative and appears stated age Head: Normocephalic, without obvious abnormality, atraumatic Neck: no adenopathy, supple, symmetrical, trachea midline and thyroid {EXAM; THYROID:18604} Lungs: clear to auscultation bilaterally Breasts: {Exam; breast:13139::"normal appearance, no masses or tenderness"} Heart: regular rate and rhythm Abdomen: soft, non-tender; bowel sounds normal; no masses,  no organomegaly Extremities: extremities normal, atraumatic, no cyanosis or edema Skin: Skin color, texture, turgor normal. No rashes or lesions Lymph nodes: Cervical, supraclavicular, and axillary nodes normal. No abnormal inguinal nodes palpated Neurologic: Grossly normal   Pelvic: External genitalia:  no lesions              Urethra:  normal appearing urethra with no masses, tenderness or lesions              Bartholins and Skenes: normal                 Vagina: normal appearing vagina  with normal color and discharge, no lesions              Cervix: {exam; cervix:14595}              Pap taken: {yes no:314532} Bimanual Exam:  Uterus:  {exam; uterus:12215}              Adnexa: {exam; adnexa:12223}               Rectovaginal: Confirms               Anus:  normal sphincter tone, no lesions  Chaperone was present for exam.  A:  Well Woman with normal exam  P:   {plan; gyn:5269::"mammogram","pap smear","return annually or prn"}

## 2016-04-22 NOTE — Progress Notes (Signed)
55 y.o. G0P0000 SingleCaucasianF here for annual exam.  Doing well.    Patient's last menstrual period was 05/17/2013 (approximate).          Sexually active: Yes.    The current method of family planning is post menopausal status.    Exercising: Yes.    Walking, weights, yoga, paddle board Smoker:  no  Health Maintenance: Pap:  12/28/13 Neg, 5/13 neg HR HPV History of abnormal Pap:  no  MMG:  06/19/15 BIRADS1:neg  Colonoscopy:  07/27/08 Normal. F/u 10 years  BMD:   Never TDaP:  12/28/13  Pneumonia vaccine(s):  No Zostavax:   No Hep C testing: 12/10/14 Neg  Screening Labs: with [facility] 03/23/16   reports that she has never smoked. She has never used smokeless tobacco. She reports that she does not drink alcohol or use drugs.  Past Medical History:  Diagnosis Date  . Fibroids    h/o   . GERD (gastroesophageal reflux disease)   . Rheumatic fever     History reviewed. No pertinent surgical history.  Current Outpatient Prescriptions  Medication Sig Dispense Refill  . Calcium-Vitamin D-Vitamin K (CHEWABLE CALCIUM PO) Take by mouth.    . cyanocobalamin (,VITAMIN B-12,) 1000 MCG/ML injection Inject 1 mL (1,000 mcg total) into the muscle every 7 (seven) days. 10 mL 3  . ibuprofen (MOTRIN IB) 200 MG tablet Take 1 tablet (200 mg total) by mouth every 6 (six) hours as needed for pain. 300 tablet 5  . Multiple Vitamin (MULTIVITAMIN) tablet Take 1 tablet by mouth daily.    . nitrofurantoin (MACRODANTIN) 100 MG capsule 1 tab po with intercourse 30 capsule 3  . Probiotic Product (PROBIOTIC & ACIDOPHILUS EX ST PO) Take 1 capsule by mouth daily.    Marland Kitchen RESVERATROL PO Take 500mg  capsule by mouth daily    . Syringe, Disposable, 3 ML MISC 1 Syringe by Does not apply route every 21 ( twenty-one) days. 25 each 11   No current facility-administered medications for this visit.     Family History  Problem Relation Age of Onset  . Cancer Mother     Adrenal  . Alcohol abuse Father   . Hyperlipidemia  Father   . Hypertension Father   . Heart disease Maternal Grandfather   . Hypertension Maternal Grandfather     ROS:  Pertinent items are noted in HPI.  Otherwise, a comprehensive ROS was negative.  Exam:   BP 126/80 (BP Location: Right Arm, Patient Position: Sitting, Cuff Size: Normal)   Pulse 60   Resp 16   Ht 5' 3.75" (1.619 m)   Wt 135 lb (61.2 kg)   LMP 05/17/2013 (Approximate)   BMI 23.35 kg/m    Height: 5' 3.75" (161.9 cm)  Ht Readings from Last 3 Encounters:  04/23/16 5' 3.75" (1.619 m)  02/19/15 5' 4.25" (1.632 m)  12/10/14 5\' 4"  (1.626 m)   General appearance: alert, cooperative and appears stated age Head: Normocephalic, without obvious abnormality, atraumatic Neck: no adenopathy, supple, symmetrical, trachea midline and thyroid normal to inspection and palpation Lungs: clear to auscultation bilaterally Breasts: normal appearance, no masses or tenderness Heart: regular rate and rhythm Abdomen: soft, non-tender; bowel sounds normal; no masses,  no organomegaly Extremities: extremities normal, atraumatic, no cyanosis or edema Skin: Skin color, texture, turgor normal. No rashes or lesions Lymph nodes: Cervical, supraclavicular, and axillary nodes normal. No abnormal inguinal nodes palpated Neurologic: Grossly normal   Pelvic: External genitalia:  no lesions  Urethra:  normal appearing urethra with no masses, tenderness or lesions              Bartholins and Skenes: normal                 Vagina: normal appearing vagina with normal color and discharge, no lesions              Cervix: no lesions              Pap taken: Yes.   Bimanual Exam:  Uterus:  normal size, contour, position, consistency, mobility, non-tender              Adnexa: normal adnexa and no mass, fullness, tenderness               Rectovaginal: Confirms               Anus:  normal sphincter tone, no lesions  Chaperone was present for exam.  A:   Well Woman with normal exam H/o UTIs  due to intercourse.  On prophylaxis.  Is using ver infrequently PMP, no HRT Grade 3 breast density  P:         Mammogram guidelines reviewed.  Order given for her to get mammogram closer to home. Pap and HR HPV obtained today Reviewed lab work done before her last visi Macrobid 100mg  po prn for prophylaxis.  #30/3RF Rx for b12 1045mcg injections with syringes.  Rx to pharmacy. AEX 1 year or follow up prn.

## 2016-04-23 ENCOUNTER — Ambulatory Visit (INDEPENDENT_AMBULATORY_CARE_PROVIDER_SITE_OTHER): Payer: Managed Care, Other (non HMO) | Admitting: Obstetrics & Gynecology

## 2016-04-23 ENCOUNTER — Encounter: Payer: Self-pay | Admitting: Obstetrics & Gynecology

## 2016-04-23 VITALS — BP 126/80 | HR 60 | Resp 16 | Ht 63.75 in | Wt 135.0 lb

## 2016-04-23 DIAGNOSIS — Z124 Encounter for screening for malignant neoplasm of cervix: Secondary | ICD-10-CM | POA: Diagnosis not present

## 2016-04-23 DIAGNOSIS — Z01419 Encounter for gynecological examination (general) (routine) without abnormal findings: Secondary | ICD-10-CM

## 2016-04-23 MED ORDER — CYANOCOBALAMIN 1000 MCG/ML IJ SOLN: 1000.0000 ug | INTRAMUSCULAR | 12 refills | Status: DC

## 2016-04-23 MED ORDER — SYRINGE (DISPOSABLE) 3 ML MISC: 1.0000 | 2 refills | Status: AC

## 2016-04-23 MED ORDER — NITROFURANTOIN MACROCRYSTAL 100 MG PO CAPS: ORAL_CAPSULE | ORAL | 3 refills | Status: DC

## 2016-04-23 NOTE — Patient Instructions (Signed)
[address] [place], [place] [address] [phone]

## 2016-04-27 LAB — PAP IG AND HPV HIGH-RISK: HPV DNA High Risk: NOT DETECTED

## 2016-05-13 ENCOUNTER — Encounter: Payer: Self-pay | Admitting: Obstetrics & Gynecology

## 2016-08-10 ENCOUNTER — Other Ambulatory Visit: Payer: Self-pay

## 2016-08-10 NOTE — Telephone Encounter (Signed)
Medication refill request: B-12 Last AEX:  04/23/16 SM Next AEX: 08/02/17  Last MMG (if hormonal medication request): 06/18/15 BIRADS 1 negative Refill authorized: 04/23/16 #59mL w/12 refills; pharmacy requesting 90-day supply

## 2016-08-10 NOTE — Telephone Encounter (Signed)
Error

## 2016-08-13 ENCOUNTER — Other Ambulatory Visit: Payer: Self-pay | Admitting: Internal Medicine

## 2016-08-13 DIAGNOSIS — Z1231 Encounter for screening mammogram for malignant neoplasm of breast: Secondary | ICD-10-CM

## 2016-08-14 MED ORDER — CYANOCOBALAMIN 1000 MCG/ML IJ SOLN: 1000.0000 ug | INTRAMUSCULAR | 4 refills | Status: DC

## 2016-08-17 ENCOUNTER — Other Ambulatory Visit: Payer: Self-pay | Admitting: *Deleted

## 2016-08-17 NOTE — Telephone Encounter (Signed)
Medication refill request: nitrofurantoin   Last AEX:  04-23-16  Next AEX: 119  Last MMG (if hormonal medication request): 06-18-15 WN L Refill authorized: please advise

## 2016-08-18 NOTE — Telephone Encounter (Signed)
Can you please contact pharmacy.  I gave her #30 with 3RF in December.  Most folks who are using this for UTI prophylaxis don't use it that fast so I want to know if she's used #30 and 3RF since December.  She's only taking it post coitally.  Thanks.

## 2016-08-18 NOTE — Telephone Encounter (Signed)
Spoke with patient and she said that she did not request refills. She has plenty. Refused refills -eh

## 2016-08-31 ENCOUNTER — Ambulatory Visit
Admission: RE | Admit: 2016-08-31 | Discharge: 2016-08-31 | Disposition: A | Discharge status: Home / Self Care | Payer: Managed Care, Other (non HMO) | Source: Ambulatory Visit | Attending: Internal Medicine | Admitting: Internal Medicine

## 2016-08-31 ENCOUNTER — Other Ambulatory Visit: Payer: Self-pay | Admitting: Internal Medicine

## 2016-08-31 DIAGNOSIS — Z1231 Encounter for screening mammogram for malignant neoplasm of breast: Secondary | ICD-10-CM

## 2017-01-28 ENCOUNTER — Encounter: Payer: Managed Care, Other (non HMO) | Admitting: Internal Medicine

## 2017-02-23 ENCOUNTER — Encounter: Payer: Self-pay | Admitting: Internal Medicine

## 2017-02-23 DIAGNOSIS — E785 Hyperlipidemia, unspecified: Secondary | ICD-10-CM

## 2017-02-23 DIAGNOSIS — R5383 Other fatigue: Secondary | ICD-10-CM

## 2017-02-23 DIAGNOSIS — E559 Vitamin D deficiency, unspecified: Secondary | ICD-10-CM

## 2017-02-23 DIAGNOSIS — R7301 Impaired fasting glucose: Secondary | ICD-10-CM

## 2017-02-23 NOTE — Telephone Encounter (Signed)
Labs requested to be done at [facility] prior to visit.  . See mychart .  Ordered/

## 2017-03-10 ENCOUNTER — Encounter: Payer: Self-pay | Admitting: Internal Medicine

## 2017-03-10 ENCOUNTER — Ambulatory Visit (INDEPENDENT_AMBULATORY_CARE_PROVIDER_SITE_OTHER): Payer: Managed Care, Other (non HMO) | Admitting: Internal Medicine

## 2017-03-10 DIAGNOSIS — Z0000 Encounter for general adult medical examination without abnormal findings: Secondary | ICD-10-CM

## 2017-03-10 MED ORDER — NITROFURANTOIN MONOHYD MACRO 100 MG PO CAPS: 100.0000 mg | ORAL_CAPSULE | Freq: Every day | ORAL | 2 refills | Status: DC

## 2017-03-10 MED ORDER — ZOSTER VAC RECOMB ADJUVANTED 50 MCG/0.5ML IM SUSR: 0.5000 mL | Freq: Once | INTRAMUSCULAR | 1 refills | Status: AC

## 2017-03-10 NOTE — Patient Instructions (Signed)
Good to see you!!  Your medication has been refilled   The ShingRx vaccine is now available in local pharmacies and is much more protective thant Zostavaxs,  It is therefore ADVISED for all interested adults over 50 to prevent shingles   Next colonoscopy is due in in 2020   Health Maintenance for Postmenopausal Women Menopause is a normal process in which your reproductive ability comes to an end. This process happens gradually over a span of months to years, usually between the ages of 39 and 34. Menopause is complete when you have missed 12 consecutive menstrual periods. It is important to talk with your health care provider about some of the most common conditions that affect postmenopausal women, such as heart disease, cancer, and bone loss (osteoporosis). Adopting a healthy lifestyle and getting preventive care can help to promote your health and wellness. Those actions can also lower your chances of developing some of these common conditions. What should I know about menopause? During menopause, you may experience a number of symptoms, such as:  Moderate-to-severe hot flashes.  Night sweats.  Decrease in sex drive.  Mood swings.  Headaches.  Tiredness.  Irritability.  Memory problems.  Insomnia.  Choosing to treat or not to treat menopausal changes is an individual decision that you make with your health care provider. What should I know about hormone replacement therapy and supplements? Hormone therapy products are effective for treating symptoms that are associated with menopause, such as hot flashes and night sweats. Hormone replacement carries certain risks, especially as you become older. If you are thinking about using estrogen or estrogen with progestin treatments, discuss the benefits and risks with your health care provider. What should I know about heart disease and stroke? Heart disease, heart attack, and stroke become more likely as you age. This may be due, in  part, to the hormonal changes that your body experiences during menopause. These can affect how your body processes dietary fats, triglycerides, and cholesterol. Heart attack and stroke are both medical emergencies. There are many things that you can do to help prevent heart disease and stroke:  Have your blood pressure checked at least every 1-2 years. High blood pressure causes heart disease and increases the risk of stroke.  If you are 27-63 years old, ask your health care provider if you should take aspirin to prevent a heart attack or a stroke.  Do not use any tobacco products, including cigarettes, chewing tobacco, or electronic cigarettes. If you need help quitting, ask your health care provider.  It is important to eat a healthy diet and maintain a healthy weight. ? Be sure to include plenty of vegetables, fruits, low-fat dairy products, and lean protein. ? Avoid eating foods that are high in solid fats, added sugars, or salt (sodium).  Get regular exercise. This is one of the most important things that you can do for your health. ? Try to exercise for at least 150 minutes each week. The type of exercise that you do should increase your heart rate and make you sweat. This is known as moderate-intensity exercise. ? Try to do strengthening exercises at least twice each week. Do these in addition to the moderate-intensity exercise.  Know your numbers.Ask your health care provider to check your cholesterol and your blood glucose. Continue to have your blood tested as directed by your health care provider.  What should I know about cancer screening? There are several types of cancer. Take the following steps to reduce your risk  and to catch any cancer development as early as possible. Breast Cancer  Practice breast self-awareness. ? This means understanding how your breasts normally appear and feel. ? It also means doing regular breast self-exams. Let your health care provider know about  any changes, no matter how small.  If you are 65 or older, have a clinician do a breast exam (clinical breast exam or CBE) every year. Depending on your age, family history, and medical history, it may be recommended that you also have a yearly breast X-ray (mammogram).  If you have a family history of breast cancer, talk with your health care provider about genetic screening.  If you are at high risk for breast cancer, talk with your health care provider about having an MRI and a mammogram every year.  Breast cancer (BRCA) gene test is recommended for women who have family members with BRCA-related cancers. Results of the assessment will determine the need for genetic counseling and BRCA1 and for BRCA2 testing. BRCA-related cancers include these types: ? Breast. This occurs in males or females. ? Ovarian. ? Tubal. This may also be called fallopian tube cancer. ? Cancer of the abdominal or pelvic lining (peritoneal cancer). ? Prostate. ? Pancreatic.  Cervical, Uterine, and Ovarian Cancer Your health care provider may recommend that you be screened regularly for cancer of the pelvic organs. These include your ovaries, uterus, and vagina. This screening involves a pelvic exam, which includes checking for microscopic changes to the surface of your cervix (Pap test).  For women ages 21-65, health care providers may recommend a pelvic exam and a Pap test every three years. For women ages 32-65, they may recommend the Pap test and pelvic exam, combined with testing for human papilloma virus (HPV), every five years. Some types of HPV increase your risk of cervical cancer. Testing for HPV may also be done on women of any age who have unclear Pap test results.  Other health care providers may not recommend any screening for nonpregnant women who are considered low risk for pelvic cancer and have no symptoms. Ask your health care provider if a screening pelvic exam is right for you.  If you have had  past treatment for cervical cancer or a condition that could lead to cancer, you need Pap tests and screening for cancer for at least 20 years after your treatment. If Pap tests have been discontinued for you, your risk factors (such as having a new sexual partner) need to be reassessed to determine if you should start having screenings again. Some women have medical problems that increase the chance of getting cervical cancer. In these cases, your health care provider may recommend that you have screening and Pap tests more often.  If you have a family history of uterine cancer or ovarian cancer, talk with your health care provider about genetic screening.  If you have vaginal bleeding after reaching menopause, tell your health care provider.  There are currently no reliable tests available to screen for ovarian cancer.  Lung Cancer Lung cancer screening is recommended for adults 48-15 years old who are at high risk for lung cancer because of a history of smoking. A yearly low-dose CT scan of the lungs is recommended if you:  Currently smoke.  Have a history of at least 30 pack-years of smoking and you currently smoke or have quit within the past 15 years. A pack-year is smoking an average of one pack of cigarettes per day for one year.  Yearly screening  should:  Continue until it has been 15 years since you quit.  Stop if you develop a health problem that would prevent you from having lung cancer treatment.  Colorectal Cancer  This type of cancer can be detected and can often be prevented.  Routine colorectal cancer screening usually begins at age 44 and continues through age 60.  If you have risk factors for colon cancer, your health care provider may recommend that you be screened at an earlier age.  If you have a family history of colorectal cancer, talk with your health care provider about genetic screening.  Your health care provider may also recommend using home test kits to  check for hidden blood in your stool.  A small camera at the end of a tube can be used to examine your colon directly (sigmoidoscopy or colonoscopy). This is done to check for the earliest forms of colorectal cancer.  Direct examination of the colon should be repeated every 5-10 years until age 76. However, if early forms of precancerous polyps or small growths are found or if you have a family history or genetic risk for colorectal cancer, you may need to be screened more often.  Skin Cancer  Check your skin from head to toe regularly.  Monitor any moles. Be sure to tell your health care provider: ? About any new moles or changes in moles, especially if there is a change in a mole's shape or color. ? If you have a mole that is larger than the size of a pencil eraser.  If any of your family members has a history of skin cancer, especially at a young age, talk with your health care provider about genetic screening.  Always use sunscreen. Apply sunscreen liberally and repeatedly throughout the day.  Whenever you are outside, protect yourself by wearing long sleeves, pants, a wide-brimmed hat, and sunglasses.  What should I know about osteoporosis? Osteoporosis is a condition in which bone destruction happens more quickly than new bone creation. After menopause, you may be at an increased risk for osteoporosis. To help prevent osteoporosis or the bone fractures that can happen because of osteoporosis, the following is recommended:  If you are 33-17 years old, get at least 1,000 mg of calcium and at least 600 mg of vitamin D per day.  If you are older than age 46 but younger than age 82, get at least 1,200 mg of calcium and at least 600 mg of vitamin D per day.  If you are older than age 4, get at least 1,200 mg of calcium and at least 800 mg of vitamin D per day.  Smoking and excessive alcohol intake increase the risk of osteoporosis. Eat foods that are rich in calcium and vitamin D, and  do weight-bearing exercises several times each week as directed by your health care provider. What should I know about how menopause affects my mental health? Depression may occur at any age, but it is more common as you become older. Common symptoms of depression include:  Low or sad mood.  Changes in sleep patterns.  Changes in appetite or eating patterns.  Feeling an overall lack of motivation or enjoyment of activities that you previously enjoyed.  Frequent crying spells.  Talk with your health care provider if you think that you are experiencing depression. What should I know about immunizations? It is important that you get and maintain your immunizations. These include:  Tetanus, diphtheria, and pertussis (Tdap) booster vaccine.  Influenza every year  before the flu season begins.  Pneumonia vaccine.  Shingles vaccine.  Your health care provider may also recommend other immunizations. This information is not intended to replace advice given to you by your health care provider. Make sure you discuss any questions you have with your health care provider. Document Released: 06/25/2005 Document Revised: 11/21/2015 Document Reviewed: 02/04/2015 Elsevier Interactive Patient Education  2018 [employer].

## 2017-03-10 NOTE — Progress Notes (Signed)
Patient ID: Lauren Bell, female    DOB: March 27, 1961  Age: 56 y.o. MRN: [id number]  The patient is here for preventive  examination and management of other chronic and acute problems.    Sees GYN for pap smears . Last one normal Dec 2017 Vision test  6 months ,  Using readers,   Sees dermatology [facility] annually  Mammogram 2017 Diagnostic colonoscopy 2010   Wohl MGM alive at 89 .  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  The roster of all physicians providing medical care to patient - is listed in the Snapshot section of the chart.  Activities of daily living:  The patient is 100% independent in all ADLs: dressing, toileting, feeding as well as independent mobility  Home safety : The patient has smoke detectors in the home. They wear seatbelts.  There are no firearms at home. There is no violence in the home.   There is no risks for hepatitis, STDs or HIV. There is no   history of blood transfusion. They have no travel history to infectious disease endemic areas of the world.  The patient has seen their dentist in the last six month. They have seen their eye doctor in the last year.They do not  have excessive sun exposure. Discussed the need for sun protection: hats, long sleeves and use of sunscreen if there is significant sun exposure.   Diet: the importance of a healthy diet is discussed. They do have a healthy diet.  The benefits of regular aerobic exercise were discussed. She exercises vigorously  4 times per week ,  60 minutes.   Depression screen: there are no signs or vegative symptoms of depression- irritability, change in appetite, anhedonia, sadness/tearfullness.   The following portions of the patient's history were reviewed and updated as appropriate: allergies, current medications, past family history, past medical history,  past surgical history, past social history  and problem list.  Visual acuity  was not assessed per patient preference since she has regular follow up with her ophthalmologist. Hearing and body mass index were assessed and reviewed.   During the course of the visit the patient was educated and counseled about appropriate screening and preventive services including : fall prevention , diabetes screening, nutrition counseling, colorectal cancer screening, and recommended immunizations.    CC: The encounter diagnosis was Routine general medical examination at a health care facility.  History Lauren Bell has a past medical history of Fibroids; GERD (gastroesophageal reflux disease); and Rheumatic fever.   She has no past surgical history on file.   Her family history includes Alcohol abuse in her father; Cancer in her mother; Heart disease in her maternal grandfather; Hyperlipidemia in her father; Hypertension in her father and maternal grandfather.She reports that she has never smoked. She has never used smokeless tobacco. She reports that she does not drink alcohol or use drugs.  Outpatient Medications Prior to Visit  Medication Sig Dispense Refill  . Calcium-Vitamin D-Vitamin K (CHEWABLE CALCIUM PO) Take by mouth.    . cyanocobalamin (,VITAMIN B-12,) 1000 MCG/ML injection Inject 1 mL (1,000 mcg total) into the muscle every 7 (seven) days. 12 mL 4  . ibuprofen (MOTRIN IB) 200 MG tablet Take 1 tablet (200 mg total) by mouth every 6 (six) hours as needed for pain. 300 tablet 5  . RESVERATROL PO Take 500mg  capsule by mouth daily    . Syringe,  Disposable, 3 ML MISC 1 Syringe by Does not apply route every 7 (seven) days. 100 each 2  . Multiple Vitamin (MULTIVITAMIN) tablet Take 1 tablet by mouth daily.    . nitrofurantoin (MACRODANTIN) 100 MG capsule 1 tab po with intercourse (Patient not taking: Reported on 03/10/2017) 30 capsule 3  . Probiotic Product (PROBIOTIC & ACIDOPHILUS EX ST PO) Take 1 capsule by mouth daily.     No facility-administered medications prior to visit.      Review of Systems   Patient denies headache, fevers, malaise, unintentional weight loss, skin rash, eye pain, sinus congestion and sinus pain, sore throat, dysphagia,  hemoptysis , cough, dyspnea, wheezing, chest pain, palpitations, orthopnea, edema, abdominal pain, nausea, melena, diarrhea, constipation, flank pain, dysuria, hematuria, urinary  Frequency, nocturia, numbness, tingling, seizures,  Focal weakness, Loss of consciousness,  Tremor, insomnia, depression, anxiety, and suicidal ideation.      Objective:  BP 114/66 (BP Location: Left Arm, Patient Position: Sitting, Cuff Size: Normal)   Pulse 83   Temp 98.1 F (36.7 C) (Oral)   Resp 14   Ht 5' 3.75" (1.619 m)   Wt 132 lb 9.6 oz (60.1 kg)   LMP 10/11/2014 (Exact Date)   SpO2 97%   BMI 22.94 kg/m   Physical Exam   General appearance: alert, cooperative and appears stated age Head: Normocephalic, without obvious abnormality, atraumatic Eyes: conjunctivae/corneas clear. PERRL, EOM's intact. Fundi benign. Ears: normal TM's and external ear canals both ears Nose: Nares normal. Septum midline. Mucosa normal. No drainage or sinus tenderness. Throat: lips, mucosa, and tongue normal; teeth and gums normal Neck: no adenopathy, no carotid bruit, no JVD, supple, symmetrical, trachea midline and thyroid not enlarged, symmetric, no tenderness/mass/nodules Lungs: clear to auscultation bilaterally Breasts: normal appearance, no masses or tenderness Heart: regular rate and rhythm, S1, S2 normal, no murmur, click, rub or gallop Abdomen: soft, non-tender; bowel sounds normal; no masses,  no organomegaly Extremities: extremities normal, atraumatic, no cyanosis or edema Pulses: 2+ and symmetric Skin: Skin color, texture, turgor normal. No rashes or lesions Neurologic: Alert and oriented X 3, normal strength and tone. Normal symmetric reflexes. Normal coordination and gait.      Assessment & Plan:   Problem List Items Addressed This  Visit    Routine general medical examination at a health care facility    Annual comprehensive preventive exam was done as well as an evaluation and management of chronic conditions .  During the course of the visit the patient was educated and counseled about appropriate screening and preventive services including :  diabetes screening, lipid analysis with projected  10 year  risk for CAD , nutrition counseling, breast, cervical and colorectal cancer screening, and recommended immunizations.  Printed recommendations for health maintenance screenings was given         I have discontinued Ms. Scalf's multivitamin, Probiotic Product (PROBIOTIC & ACIDOPHILUS EX ST PO), and nitrofurantoin. I am also having her start on nitrofurantoin (macrocrystal-monohydrate) and Zoster Vaccine Adjuvanted. Additionally, I am having her maintain her RESVERATROL PO, ibuprofen, Calcium-Vitamin D-Vitamin K (CHEWABLE CALCIUM PO), Syringe (Disposable), cyanocobalamin, and nitrofurantoin (macrocrystal-monohydrate).  Meds ordered this encounter  Medications  . nitrofurantoin, macrocrystal-monohydrate, (MACROBID) 100 MG capsule    Sig: Take 100 mg by mouth 2 (two) times daily.  . nitrofurantoin, macrocrystal-monohydrate, (MACROBID) 100 MG capsule    Sig: Take 1 capsule (100 mg total) by mouth at bedtime. After intercourse    Dispense:  30 capsule    Refill:  2  .  Zoster Vaccine Adjuvanted [facility]) injection    Sig: Inject 0.5 mLs into the muscle once.    Dispense:  1 each    Refill:  1    Medications Discontinued During This Encounter  Medication Reason  . Multiple Vitamin (MULTIVITAMIN) tablet Patient has not taken in last 30 days  . nitrofurantoin (MACRODANTIN) 100 MG capsule Patient has not taken in last 30 days  . Probiotic Product (PROBIOTIC & ACIDOPHILUS EX ST PO) Patient has not taken in last 30 days    Follow-up: No Follow-up on file.   Crecencio Mc, MD

## 2017-03-12 NOTE — Assessment & Plan Note (Signed)
Annual comprehensive preventive exam was done as well as an evaluation and management of chronic conditions .  During the course of the visit the patient was educated and counseled about appropriate screening and preventive services including :  diabetes screening, lipid analysis with projected  10 year  risk for CAD , nutrition counseling, breast, cervical and colorectal cancer screening, and recommended immunizations.  Printed recommendations for health maintenance screenings was given 

## 2017-08-02 ENCOUNTER — Ambulatory Visit: Payer: Managed Care, Other (non HMO) | Admitting: Obstetrics & Gynecology

## 2017-10-14 ENCOUNTER — Other Ambulatory Visit: Payer: Self-pay | Admitting: Internal Medicine

## 2017-10-14 DIAGNOSIS — Z1231 Encounter for screening mammogram for malignant neoplasm of breast: Secondary | ICD-10-CM

## 2017-11-25 ENCOUNTER — Ambulatory Visit
Admission: RE | Admit: 2017-11-25 | Discharge: 2017-11-25 | Disposition: A | Discharge status: Home / Self Care | Payer: Managed Care, Other (non HMO) | Source: Ambulatory Visit | Attending: Internal Medicine | Admitting: Internal Medicine

## 2017-11-25 ENCOUNTER — Other Ambulatory Visit: Payer: Self-pay

## 2017-11-25 ENCOUNTER — Ambulatory Visit: Payer: BLUE CROSS/BLUE SHIELD | Admitting: Obstetrics & Gynecology

## 2017-11-25 ENCOUNTER — Encounter: Payer: Self-pay | Admitting: Obstetrics & Gynecology

## 2017-11-25 VITALS — BP 130/80 | HR 72 | Resp 16 | Ht 63.75 in | Wt 131.2 lb

## 2017-11-25 DIAGNOSIS — Z1231 Encounter for screening mammogram for malignant neoplasm of breast: Secondary | ICD-10-CM

## 2017-11-25 DIAGNOSIS — Z0000 Encounter for general adult medical examination without abnormal findings: Secondary | ICD-10-CM

## 2017-11-25 DIAGNOSIS — Z01419 Encounter for gynecological examination (general) (routine) without abnormal findings: Secondary | ICD-10-CM

## 2017-11-25 MED ORDER — CYANOCOBALAMIN 1000 MCG/ML IJ SOLN: 1000.0000 ug | INTRAMUSCULAR | 4 refills | Status: DC

## 2017-11-25 MED ORDER — NITROFURANTOIN MONOHYD MACRO 100 MG PO CAPS: ORAL_CAPSULE | ORAL | 3 refills | Status: DC

## 2017-11-25 NOTE — Patient Instructions (Signed)
Femdophilus vaginal/urinary tract health--probiotic

## 2017-11-25 NOTE — Progress Notes (Signed)
57 y.o. G0P0000 SingleCaucasianF here for annual exam.  Doing well.  Work changed this past year.  Working in same position at [employer].  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  Pt reports she is having a lot of stressors at this time.  Does not want to be in a supervisory role.    Denies vaginal bleeding.  Patient's last menstrual period was 10/11/2014 (exact date).          Sexually active: Yes.    The current method of family planning is post menopausal status.    Exercising: Yes.    cardio, some strength training  Smoker:  no  Health Maintenance: Pap:  04/23/16 neg, neg HR HPV  12/28/13 neg  History of abnormal Pap:  no MMG:  08/31/16 BIRADS1:neg. Had one today. Colonoscopy:  07/2008 f/u 10 years  BMD:   Never TDaP:  2015 Pneumonia vaccine(s):  No Shingrix:   D/w pt today Hep C testing: 2016 Neg  Screening Labs: here today   reports that she has never smoked. She has never used smokeless tobacco. She reports that she does not drink alcohol or use drugs.  Past Medical History:  Diagnosis Date  . Fibroids    h/o   . GERD (gastroesophageal reflux disease)   . Rheumatic fever     History reviewed. No pertinent surgical history.  Current Outpatient Medications  Medication Sig Dispense Refill  . Calcium-Vitamin D-Vitamin K (CHEWABLE CALCIUM PO) Take by mouth.    . cyanocobalamin (,VITAMIN B-12,) 1000 MCG/ML injection Inject 1 mL (1,000 mcg total) into the muscle every 7 (seven) days. 12 mL 4  . ibuprofen (MOTRIN IB) 200 MG tablet Take 1 tablet (200 mg total) by mouth every 6 (six) hours as needed for pain. 300 tablet 5  . nitrofurantoin, macrocrystal-monohydrate, (MACROBID) 100 MG capsule Take 1 capsule (100 mg total) by mouth at bedtime. After intercourse 30 capsule 2  . RESVERATROL PO Take 500mg  capsule by mouth daily    . Syringe, Disposable, 3 ML MISC 1 Syringe by Does not apply route every 7 (seven) days. 100 each 2   No current facility-administered medications for this visit.      Family History  Problem Relation Age of Onset  . Cancer Mother        Adrenal  . Alcohol abuse Father   . Hyperlipidemia Father   . Hypertension Father   . Heart disease Maternal Grandfather   . Hypertension Maternal Grandfather     Review of Systems  All other systems reviewed and are negative.   Exam:   BP 130/80 (BP Location: Right Arm, Patient Position: Sitting, Cuff Size: Normal)   Pulse 72   Resp 16   Ht 5' 3.75" (1.619 m)   Wt 131 lb 3.2 oz (59.5 kg)   LMP 10/11/2014 (Exact Date)   BMI 22.70 kg/m    Height: 5' 3.75" (161.9 cm)  Ht Readings from Last 3 Encounters:  11/25/17 5' 3.75" (1.619 m)  03/10/17 5' 3.75" (1.619 m)  04/23/16 5' 3.75" (1.619 m)    General appearance: alert, cooperative and appears stated age Head: Normocephalic, without obvious abnormality, atraumatic Neck: no adenopathy, supple, symmetrical, trachea midline and thyroid normal to inspection and palpation Lungs: clear to auscultation bilaterally Breasts: normal appearance, no masses or tenderness Heart: regular rate and rhythm Abdomen: soft, non-tender; bowel sounds normal; no masses,  no organomegaly Extremities: extremities normal, atraumatic, no cyanosis or edema Skin: Skin color, texture, turgor normal. No rashes or lesions Lymph  nodes: Cervical, supraclavicular, and axillary nodes normal. No abnormal inguinal nodes palpated Neurologic: Grossly normal   Pelvic: External genitalia:  no lesions              Urethra:  normal appearing urethra with no masses, tenderness or lesions              Bartholins and Skenes: normal                 Vagina: normal appearing vagina with normal color and discharge, no lesions              Cervix: no lesions              Pap taken: No. Bimanual Exam:  Uterus:  normal size, contour, position, consistency, mobility, non-tender              Adnexa: normal adnexa and no mass, fullness, tenderness               Rectovaginal: Confirms                Anus:  normal sphincter tone, no lesions  Chaperone was present for exam.  A:  Well Woman with normal exam Increased social stressors this year esp with work H/o B 12 deficiency Elevated lipids Vit D deficiency PMP, no HRT Recurrent UTIs, which are much improved  P:   Mammogram guidelines reviewed.  Doing 3D. Plan BMD by age 57 pap smear neg with neg HR HVP 12/17.  No pap smear obtained today. Lab work obtained today:  Vit D, TSH, Lipids, CMP, CBC, B12 and folate level RF for macrobid 1 post coitally as needed.  #30/2RF RF for b12 injections--61ml every 7 days.  1 year RF given Shingrix vaccination discussed today Return annually or prn

## 2017-11-26 LAB — COMPREHENSIVE METABOLIC PANEL
ALT: 16 IU/L (ref 0–32)
AST: 17 IU/L (ref 0–40)
Albumin/Globulin Ratio: 2.4 — ABNORMAL HIGH (ref 1.2–2.2)
Albumin: 5 g/dL (ref 3.5–5.5)
Alkaline Phosphatase: 91 IU/L (ref 39–117)
BUN/Creatinine Ratio: 10 (ref 9–23)
BUN: 8 mg/dL (ref 6–24)
Bilirubin Total: 0.4 mg/dL (ref 0.0–1.2)
CO2: 25 mmol/L (ref 20–29)
Calcium: 9.6 mg/dL (ref 8.7–10.2)
Chloride: 101 mmol/L (ref 96–106)
Creatinine, Ser: 0.81 mg/dL (ref 0.57–1.00)
GFR calc Af Amer: 93 mL/min/{1.73_m2} (ref >59)
GFR calc non Af Amer: 81 mL/min/{1.73_m2} (ref >59)
Globulin, Total: 2.1 g/dL (ref 1.5–4.5)
Glucose: 78 mg/dL (ref 65–99)
Potassium: 4.4 mmol/L (ref 3.5–5.2)
Sodium: 142 mmol/L (ref 134–144)
Total Protein: 7.1 g/dL (ref 6.0–8.5)

## 2017-11-26 LAB — CBC
Hematocrit: 43 % (ref 34.0–46.6)
Hemoglobin: 14.8 g/dL (ref 11.1–15.9)
MCH: 31.2 pg (ref 26.6–33.0)
MCHC: 34.4 g/dL (ref 31.5–35.7)
MCV: 91 fL (ref 79–97)
Platelets: 270 10*3/uL (ref 150–450)
RBC: 4.75 x10E6/uL (ref 3.77–5.28)
RDW: 13.2 % (ref 12.3–15.4)
WBC: 6.9 10*3/uL (ref 3.4–10.8)

## 2017-11-26 LAB — TSH: TSH: 1.5 u[IU]/mL (ref 0.450–4.500)

## 2017-11-26 LAB — LIPID PANEL
Chol/HDL Ratio: 2.9 ratio (ref 0.0–4.4)
Cholesterol, Total: 245 mg/dL — ABNORMAL HIGH (ref 100–199)
HDL: 85 mg/dL (ref >39)
LDL Calculated: 140 mg/dL — ABNORMAL HIGH (ref 0–99)
Triglycerides: 98 mg/dL (ref 0–149)
VLDL Cholesterol Cal: 20 mg/dL (ref 5–40)

## 2017-11-26 LAB — B12 AND FOLATE PANEL
FOLATE: 11.2 ng/mL (ref >3.0)
VITAMIN B 12: 693 pg/mL (ref 232–1245)

## 2017-11-26 LAB — VITAMIN D 25 HYDROXY (VIT D DEFICIENCY, FRACTURES): VIT D 25 HYDROXY: 24.8 ng/mL — AB (ref 30.0–100.0)

## 2019-01-09 ENCOUNTER — Other Ambulatory Visit: Payer: Self-pay | Admitting: Obstetrics & Gynecology

## 2019-01-09 DIAGNOSIS — Z1231 Encounter for screening mammogram for malignant neoplasm of breast: Secondary | ICD-10-CM

## 2019-01-24 ENCOUNTER — Ambulatory Visit (INDEPENDENT_AMBULATORY_CARE_PROVIDER_SITE_OTHER): Payer: BC Managed Care – PPO | Admitting: Internal Medicine

## 2019-01-24 ENCOUNTER — Other Ambulatory Visit (INDEPENDENT_AMBULATORY_CARE_PROVIDER_SITE_OTHER): Payer: BC Managed Care – PPO

## 2019-01-24 ENCOUNTER — Other Ambulatory Visit: Payer: Self-pay

## 2019-01-24 ENCOUNTER — Encounter: Payer: Self-pay | Admitting: Internal Medicine

## 2019-01-24 VITALS — Ht 63.75 in | Wt 131.0 lb

## 2019-01-24 DIAGNOSIS — E785 Hyperlipidemia, unspecified: Secondary | ICD-10-CM | POA: Diagnosis not present

## 2019-01-24 DIAGNOSIS — E538 Deficiency of other specified B group vitamins: Secondary | ICD-10-CM

## 2019-01-24 DIAGNOSIS — B350 Tinea barbae and tinea capitis: Secondary | ICD-10-CM

## 2019-01-24 DIAGNOSIS — Z7189 Other specified counseling: Secondary | ICD-10-CM

## 2019-01-24 DIAGNOSIS — E559 Vitamin D deficiency, unspecified: Secondary | ICD-10-CM | POA: Diagnosis not present

## 2019-01-24 MED ORDER — GRISEOFULVIN ULTRAMICROSIZE 250 MG PO TABS: 250.0000 mg | ORAL_TABLET | Freq: Two times a day (BID) | ORAL | 1 refills | Status: DC

## 2019-01-24 NOTE — Progress Notes (Signed)
Virtual Visit converted to telephone note  This visit type was conducted due to national recommendations for restrictions regarding the COVID-19 pandemic (e.g. social distancing).  This format is felt to be most appropriate for this patient at this time.  All issues noted in this document were discussed and addressed.  No physical exam was performed (except for noted visual exam findings with Video Visits).   I  connected with@ on 01/24/19 at 10:00 AM EDT by a video enabled telemedicine application and verified that  She was speaking with the correct person using two identifiers.   Location patient: home Location provider: work or home office Persons participating in the virtual visit: patient, provider  I discussed the limitations, risks, security and privacy concerns of performing an evaluation and management service by telephone and the availability of in person appointments. I also discussed with the patient that there may be a patient responsible charge related to this service. The patient expressed understanding and agreed to proceed.   Reason for visit: itchy scaling rash on scalp HPI:  58 yr old Bell with a history of recurrent scaling scalp rash that has been occurring Intermittently for the past 15 years.  Current episode has been  the worst, and has spread to the occipital region of scalp.  Thinks it may have been aggravated by frequent  lakre swimming .  Itchy,  Scaling on scalp and back of neck. Told by a NP during an E visit that it was tinea capitis but not offered treatment due to need for follow up.  Last seen by e Ot 2018   The patient has no signs or symptoms of COVID 19 infection (fever, cough, sore throat  or shortness of breath beyond what is typical for patient).  Patient denies contact with other persons with the above mentioned symptoms or with anyone confirmed to have COVID 19 .  She has been minimizing her contact with the public and using a mask and hand sanitizer  when she comes into any contact with the public. [place]  3) History of B12 deficiency: has not had level checked since 2019, no longer taking IM injections,  Taking oral.   4) [id number] PAP Dec 2017, colonoscopy March 2010 ([facility]) , mammogram scheduled for Oct   ROS: Patient denies headache, fevers, malaise, unintentional weight loss, skin rash, eye pain, sinus congestion and sinus pain, sore throat, dysphagia,  hemoptysis , cough, dyspnea, wheezing, chest pain, palpitations, orthopnea, edema, abdominal pain, nausea, melena, diarrhea, constipation, flank pain, dysuria, hematuria, urinary  Frequency, nocturia, numbness, tingling, seizures,  Focal weakness, Loss of consciousness,  Tremor, insomnia, depression, anxiety, and suicidal ideation.      Past Medical History:  Diagnosis Date  . Fibroids    h/o   . GERD (gastroesophageal reflux disease)   . Rheumatic fever     No past surgical history on file.  Family History  Problem Relation Age of Onset  . Cancer Mother        Adrenal  . Alcohol abuse Father   . Hyperlipidemia Father   . Hypertension Father   . Heart disease Maternal Grandfather   . Hypertension Maternal Grandfather     SOCIAL HX:  reports that she has never smoked. She has never used smokeless tobacco. She reports that she does not drink alcohol or use drugs.   Current Outpatient Medications:  .  Calcium-Vitamin D-Vitamin K (CHEWABLE CALCIUM PO), Take by mouth., Disp: ,  Rfl:  .  cyanocobalamin (,VITAMIN B-12,) 1000 MCG/ML injection, Inject 1 mL (1,000 mcg total) into the muscle every 7 (seven) days., Disp: 12 mL, Rfl: 4 .  ibuprofen (MOTRIN IB) 200 MG tablet, Take 1 tablet (200 mg total) by mouth every 6 (six) hours as needed for pain., Disp: 300 tablet, Rfl: 5 .  RESVERATROL PO, Take 500mg  capsule by mouth daily, Disp: , Rfl:  .  Syringe, Disposable, 3 ML MISC, 1 Syringe by Does not apply route every 7 (seven)  days., Disp: 100 each, Rfl: 2 .  griseofulvin (GRIS-PEG) 250 MG tablet, Take 1 tablet (250 mg total) by mouth 2 (two) times daily., Disp: 60 tablet, Rfl: 1 .  nitrofurantoin, macrocrystal-monohydrate, (MACROBID) 100 MG capsule, 1 po as needed for UTI prevention (Patient not taking: Reported on 01/24/2019), Disp: 30 capsule, Rfl: 3  EXAM:  VITALS per patient if applicable:  GENERAL: alert, oriented, appears well and in no acute distress  HEENT: atraumatic, conjunttiva clear, no obvious abnormalities on inspection of external nose and ears  Scalp: white scaling placques on hair line , no alopecia   NECK: normal movements of the head and neck  LUNGS: on inspection no signs of respiratory distress, breathing rate appears normal, no obvious gross SOB, gasping or wheezing  CV: no obvious cyanosis  MS: moves all visible extremities without noticeable abnormality  PSYCH/NEURO: pleasant and cooperative, no obvious depression or anxiety, speech and thought processing grossly intact  ASSESSMENT AND PLAN:  Tinea capitis Appearance consistent with tinea capitis.  Needs LFTS to initiate treatment with griseofulvin 500 mg daily in divided doses  For 6 weeks.   Educated About Covid-19 Virus Infection Educated patient on the newly broadened list of signs and symptoms of COVID-19 infection and ways to avoid the viral infection including washing hands frequently with soap and water,  using hand sanitizer if unable to wash, avoiding touching face,  staying at home and limiting visitors,  and avoiding contact with people coming in and out of home.  Discussed the potential ineffectiveness of hand sanitizer if left in environments > 110 degrees (ie , the car).  Reminded patient to call office with questions/concerns.  The importance of continued social distancing was discussed today . Patient was screened for the development of any unsafe behaviors or habits that may have developed as a result of the social  impact of the virus , including alcohol abuse,  Domestic violence, tobacco abuse and overeating.     Dietary B12 deficiency She is due for repeat testing     I discussed the assessment and treatment plan with the patient. The patient was provided an opportunity to ask questions and all were answered. The patient agreed with the plan and demonstrated an understanding of the instructions.   The patient was advised to call back or seek an in-person evaluation if the symptoms worsen or if the condition fails to improve as anticipated.  I provided 15 minutes of non-face-to-face time during this encounter.   Crecencio Mc, MD

## 2019-01-25 ENCOUNTER — Other Ambulatory Visit: Payer: Self-pay

## 2019-01-25 ENCOUNTER — Other Ambulatory Visit: Payer: Self-pay | Admitting: Internal Medicine

## 2019-01-25 DIAGNOSIS — Z79899 Other long term (current) drug therapy: Secondary | ICD-10-CM

## 2019-01-25 DIAGNOSIS — L409 Psoriasis, unspecified: Secondary | ICD-10-CM | POA: Insufficient documentation

## 2019-01-25 DIAGNOSIS — Z7189 Other specified counseling: Secondary | ICD-10-CM | POA: Insufficient documentation

## 2019-01-25 DIAGNOSIS — B350 Tinea barbae and tinea capitis: Secondary | ICD-10-CM | POA: Insufficient documentation

## 2019-01-25 LAB — CBC WITH DIFFERENTIAL/PLATELET
Basophils Absolute: 0.1 10*3/uL (ref 0.0–0.1)
Basophils Relative: 0.7 % (ref 0.0–3.0)
Eosinophils Absolute: 0.1 10*3/uL (ref 0.0–0.7)
Eosinophils Relative: 0.8 % (ref 0.0–5.0)
HCT: 39.2 % (ref 36.0–46.0)
Hemoglobin: 13.5 g/dL (ref 12.0–15.0)
Lymphocytes Relative: 32.1 % (ref 12.0–46.0)
Lymphs Abs: 2.5 10*3/uL (ref 0.7–4.0)
MCHC: 34.4 g/dL (ref 30.0–36.0)
MCV: 91.6 fl (ref 78.0–100.0)
Monocytes Absolute: 0.5 10*3/uL (ref 0.1–1.0)
Monocytes Relative: 6.5 % (ref 3.0–12.0)
Neutro Abs: 4.6 10*3/uL (ref 1.4–7.7)
Neutrophils Relative %: 59.9 % (ref 43.0–77.0)
Platelets: 237 10*3/uL (ref 150.0–400.0)
RBC: 4.27 Mil/uL (ref 3.87–5.11)
RDW: 12.4 % (ref 11.5–15.5)
WBC: 7.7 10*3/uL (ref 4.0–10.5)

## 2019-01-25 LAB — COMPREHENSIVE METABOLIC PANEL
ALT: 15 U/L (ref 0–35)
AST: 18 U/L (ref 0–37)
Albumin: 4.4 g/dL (ref 3.5–5.2)
Alkaline Phosphatase: 83 U/L (ref 39–117)
BUN: 7 mg/dL (ref 6–23)
CO2: 28 mEq/L (ref 19–32)
Calcium: 9.2 mg/dL (ref 8.4–10.5)
Chloride: 99 mEq/L (ref 96–112)
Creatinine, Ser: 0.81 mg/dL (ref 0.40–1.20)
GFR: 72.49 mL/min (ref >60.00)
Glucose, Bld: 83 mg/dL (ref 70–99)
Potassium: 3.7 mEq/L (ref 3.5–5.1)
Sodium: 134 mEq/L — ABNORMAL LOW (ref 135–145)
Total Bilirubin: 0.7 mg/dL (ref 0.2–1.2)
Total Protein: 6.4 g/dL (ref 6.0–8.3)

## 2019-01-25 LAB — LIPID PANEL
Cholesterol: 204 mg/dL — ABNORMAL HIGH (ref 0–200)
HDL: 78.8 mg/dL (ref >39.00)
LDL Cholesterol: 112 mg/dL — ABNORMAL HIGH (ref 0–99)
NonHDL: 125.23
Total CHOL/HDL Ratio: 3
Triglycerides: 67 mg/dL (ref 0.0–149.0)
VLDL: 13.4 mg/dL (ref 0.0–40.0)

## 2019-01-25 LAB — VITAMIN B12: Vitamin B-12: 357 pg/mL (ref 211–911)

## 2019-01-25 LAB — TSH: TSH: 1.06 u[IU]/mL (ref 0.35–4.50)

## 2019-01-25 LAB — VITAMIN D 25 HYDROXY (VIT D DEFICIENCY, FRACTURES): VITD: 40.38 ng/mL (ref 30.00–100.00)

## 2019-01-25 NOTE — Assessment & Plan Note (Signed)
She is due for repeat testing

## 2019-01-25 NOTE — Assessment & Plan Note (Signed)
Appearance consistent with tinea capitis.  Needs LFTS to initiate treatment with griseofulvin 500 mg daily in divided doses  For 6 weeks.

## 2019-01-25 NOTE — Assessment & Plan Note (Signed)
Educated patient on the newly broadened list of signs and symptoms of COVID-19 infection and ways to avoid the viral infection including washing hands frequently with soap and water,  using hand sanitizer if unable to wash, avoiding touching face,  staying at home and limiting visitors,  and avoiding contact with people coming in and out of home.  Discussed the potential ineffectiveness of hand sanitizer if left in environments > 110 degrees (ie , the car).  Reminded patient to call office with questions/concerns.  The importance of continued social distancing was discussed today . Patient was screened for the development of any unsafe behaviors or habits that may have developed as a result of the social impact of the virus , including alcohol abuse,  Domestic violence, tobacco abuse and overeating.    

## 2019-02-08 ENCOUNTER — Telehealth: Payer: Self-pay

## 2019-02-08 NOTE — Telephone Encounter (Signed)
Copied from [place] [id number]. Topic: General - Other >> Feb 08, 2019  9:02 AM Carolyn Stare wrote: Pt call to say the below is not working for her infection and is asking if she need to try something else  griseofulvin (GRIS-PEG) 250 MG tablet

## 2019-02-10 ENCOUNTER — Telehealth: Payer: Self-pay | Admitting: Internal Medicine

## 2019-02-10 ENCOUNTER — Other Ambulatory Visit: Payer: Self-pay | Admitting: Internal Medicine

## 2019-02-10 MED ORDER — TERBINAFINE HCL 250 MG PO TABS: 250.0000 mg | ORAL_TABLET | Freq: Every day | ORAL | 2 refills | Status: DC

## 2019-02-22 ENCOUNTER — Other Ambulatory Visit: Payer: Self-pay

## 2019-02-22 ENCOUNTER — Ambulatory Visit
Admission: RE | Admit: 2019-02-22 | Discharge: 2019-02-22 | Disposition: A | Discharge status: Home / Self Care | Payer: BC Managed Care – PPO | Source: Ambulatory Visit | Attending: Obstetrics & Gynecology | Admitting: Obstetrics & Gynecology

## 2019-02-22 DIAGNOSIS — Z1231 Encounter for screening mammogram for malignant neoplasm of breast: Secondary | ICD-10-CM

## 2019-03-13 ENCOUNTER — Other Ambulatory Visit: Payer: Self-pay

## 2019-03-15 ENCOUNTER — Encounter: Payer: Self-pay | Admitting: Obstetrics & Gynecology

## 2019-03-15 ENCOUNTER — Ambulatory Visit: Payer: BC Managed Care – PPO | Admitting: Obstetrics & Gynecology

## 2019-03-15 ENCOUNTER — Other Ambulatory Visit: Payer: Self-pay

## 2019-03-15 VITALS — BP 128/70 | HR 84 | Temp 97.2°F | Resp 12 | Ht 63.75 in | Wt 132.0 lb

## 2019-03-15 DIAGNOSIS — Z01419 Encounter for gynecological examination (general) (routine) without abnormal findings: Secondary | ICD-10-CM | POA: Diagnosis not present

## 2019-03-15 MED ORDER — NITROFURANTOIN MONOHYD MACRO 100 MG PO CAPS: ORAL_CAPSULE | ORAL | 3 refills | Status: DC

## 2019-03-15 NOTE — Patient Instructions (Signed)
Replens- vaginal moisturizer

## 2019-03-15 NOTE — Progress Notes (Addendum)
58 y.o. G0P0000 Single White or Caucasian female here for annual exam.  She reports having a issue with itching that was initially treated with an antifungal from PCP.  It worsened and she ended up Patient would like to discuss LFT due to taking an Antifungal for 19 days. Patient had a yeast infection a couple of months ago and would like to discuss what to do if this happens again.    Denies vaginal bleeding.    Has noticed when she gets up in the middle of the night to void, she does feel her stream is slower/restricted.  She feels like she empties completely.  There is no urgency or burning.  Only gets up once at night, if that.  No blood in urine.  Does not leak.    Had a stubborn yeast vaginitis that occurred in the late summer.  She ended up taking three doses of OTC vaginal antifungal products before this resolved.    Patient's last menstrual period was 10/11/2014 (exact date).          Sexually active: Yes.    The current method of family planning is post menopausal status.    Exercising: No.  The patient does not participate in regular exercise at present. Smoker:  no  Health Maintenance: Pap:  04/23/16 neg, neg HR HPV             12/28/13 neg  History of abnormal Pap:  no MMG:  02/22/19 BIRADS 1 negative/density c Colonoscopy:  07/2008 f/u 10 years  BMD:   never TDaP:  2015 Pneumonia vaccine(s):  n/a Shingrix:   12/13/18 Hep C testing: 2016 Neg Screening Labs: PCP   reports that she has never smoked. She has never used smokeless tobacco. She reports that she does not drink alcohol or use drugs.  Past Medical History:  Diagnosis Date  . Fibroids    h/o   . GERD (gastroesophageal reflux disease)   . Rheumatic fever     History reviewed. No pertinent surgical history.  Current Outpatient Medications  Medication Sig Dispense Refill  . betamethasone dipropionate 0.05 % lotion Apply topically.    . Calcium-Vitamin D-Vitamin K (CHEWABLE CALCIUM PO) Take by mouth.    .  cyanocobalamin (,VITAMIN B-12,) 1000 MCG/ML injection Inject 1 mL (1,000 mcg total) into the muscle every 7 (seven) days. 12 mL 4  . ibuprofen (MOTRIN IB) 200 MG tablet Take 1 tablet (200 mg total) by mouth every 6 (six) hours as needed for pain. 300 tablet 5  . nitrofurantoin, macrocrystal-monohydrate, (MACROBID) 100 MG capsule 1 po as needed for UTI prevention 30 capsule 3  . RESVERATROL PO Take 500mg  capsule by mouth daily    . Syringe, Disposable, 3 ML MISC 1 Syringe by Does not apply route every 7 (seven) days. 100 each 2   No current facility-administered medications for this visit.     Family History  Problem Relation Age of Onset  . Cancer Mother        Adrenal  . Alcohol abuse Father   . Hyperlipidemia Father   . Hypertension Father   . Heart disease Maternal Grandfather   . Hypertension Maternal Grandfather     Review of Systems  Constitutional: Negative.   HENT: Negative.   Eyes: Negative.   Respiratory: Negative.   Cardiovascular: Negative.   Gastrointestinal: Negative.   Endocrine: Negative.   Genitourinary: Negative.   Musculoskeletal: Negative.   Skin: Negative.   Allergic/Immunologic: Negative.   Neurological: Negative.  Hematological: Negative.   Psychiatric/Behavioral: Negative.     Exam:   BP 128/70 (BP Location: Left Arm, Patient Position: Sitting, Cuff Size: Normal)   Pulse 84   Temp (!) 97.2 F (36.2 C) (Temporal)   Resp 12   Ht 5' 3.75" (1.619 m)   Wt 132 lb (59.9 kg)   LMP 10/11/2014 (Exact Date)   BMI 22.84 kg/m    Height: 5' 3.75" (161.9 cm)  Ht Readings from Last 3 Encounters:  03/15/19 5' 3.75" (1.619 m)  01/24/19 5' 3.75" (1.619 m)  11/25/17 5' 3.75" (1.619 m)    General appearance: alert, cooperative and appears stated age Head: Normocephalic, without obvious abnormality, atraumatic Neck: no adenopathy, supple, symmetrical, trachea midline and thyroid normal to inspection and palpation Lungs: clear to auscultation  bilaterally Breasts: normal appearance, no masses or tenderness Heart: regular rate and rhythm Abdomen: soft, non-tender; bowel sounds normal; no masses,  no organomegaly Extremities: extremities normal, atraumatic, no cyanosis or edema Skin: Skin color, texture, turgor normal. No rashes or lesions Lymph nodes: Cervical, supraclavicular, and axillary nodes normal. No abnormal inguinal nodes palpated Neurologic: Grossly normal   Pelvic: External genitalia:  no lesions              Urethra:  normal appearing urethra with no masses, tenderness or lesions              Bartholins and Skenes: normal                 Vagina: normal appearing vagina with normal color and discharge, no lesions              Cervix: no lesions              Pap taken: No. Bimanual Exam:  Uterus:  normal size, contour, position, consistency, mobility, non-tender              Adnexa: normal adnexa and no mass, fullness, tenderness               Rectovaginal: Confirms               Anus:  normal sphincter tone, no lesions  Chaperone was present for exam.  A:  Well Woman with normal exam PMP, no HRT H/o B12 deficiency H/O recurrent UTIs, improved over the last several years  P:   Mammogram guidelines reviewed.  Doing 3D MMG. Discussed starting BMD between 60 and 65 pap smear with neg HR HPV 2017.  New guidelines reviewed.   Vaccines are UTD Colonoscopy UTD RF for macrobdi 100mg  post coitally and to be used if has UTI. return annually or prn

## 2019-05-07 ENCOUNTER — Ambulatory Visit: Payer: BC Managed Care – PPO | Admitting: Certified Nurse Midwife

## 2019-05-07 ENCOUNTER — Encounter: Payer: Self-pay | Admitting: Certified Nurse Midwife

## 2019-05-07 ENCOUNTER — Other Ambulatory Visit: Payer: Self-pay

## 2019-05-07 VITALS — BP 120/68 | HR 70 | Temp 98.2°F | Resp 16 | Wt 136.0 lb

## 2019-05-07 DIAGNOSIS — Z0000 Encounter for general adult medical examination without abnormal findings: Secondary | ICD-10-CM | POA: Diagnosis not present

## 2019-05-07 DIAGNOSIS — Z01419 Encounter for gynecological examination (general) (routine) without abnormal findings: Secondary | ICD-10-CM

## 2019-05-07 DIAGNOSIS — N949 Unspecified condition associated with female genital organs and menstrual cycle: Secondary | ICD-10-CM | POA: Diagnosis not present

## 2019-05-07 DIAGNOSIS — N951 Menopausal and female climacteric states: Secondary | ICD-10-CM | POA: Diagnosis not present

## 2019-05-07 NOTE — Patient Instructions (Signed)
Atrophic Vaginitis Atrophic vaginitis is a condition in which the tissues that line the vagina become dry and thin. This condition occurs in women who have stopped having their period. It is caused by a drop in a female hormone (estrogen). This hormone helps:  To keep the vagina moist.  To make a clear fluid. This clear fluid helps: ? To make the vagina ready for sex. ? To protect the vagina from infection. If the lining of the vagina is dry and thin, it may cause irritation, burning, or itchiness. It may also:  Make sex painful.  Make an exam of your vagina painful.  Cause bleeding.  Make you lose interest in sex.  Cause a burning feeling when you pee (urinate).  Cause a brown or yellow fluid to come from your vagina. Some women do not have symptoms. Follow these instructions at home: Medicines  Take over-the-counter and prescription medicines only as told by your doctor.  Do not use herbs or other medicines unless your doctor says it is okay.  Use medicines for for dryness. These include: ? Oils to make the vagina soft. ? Creams. ? Moisturizers. General instructions  Do not douche.  Do not use products that can make your vagina dry. These include: ? Scented sprays. ? Scented tampons. ? Scented soaps.  Sex can help increase blood flow and soften the tissue in the vagina. If it hurts to have sex: ? Tell your partner. ? Use products to make sex more comfortable. Use these only as told by your doctor. Contact a doctor if you:  Have discharge from the vagina that is different than usual.  Have a bad smell coming from your vagina.  Have new symptoms.  Do not get better.  Get worse. Summary  Atrophic vaginitis is a condition in which the lining of the vagina becomes dry and thin.  This condition affects women who have stopped having their periods.  Treatment may include using products that help make the vagina soft.  Call a doctor if do not get better with  treatment. This information is not intended to replace advice given to you by your health care provider. Make sure you discuss any questions you have with your health care provider. Document Released: 10/20/2007 Document Revised: 05/16/2017 Document Reviewed: 05/16/2017 Elsevier Patient Education  2020 Elsevier Inc.  

## 2019-05-07 NOTE — Progress Notes (Signed)
58 y.o. Married Caucasian female G0P0000, menopausal, here with complaint of vaginal symptoms of itching, burning, and increase discharge. Describes discharge as scant to none. Having pain with intercourse and pain and burning when wiping for urination. Treated self with one Monistat ovule and had more increased burning. Has not used the second one yet. Onset of symptoms 1 days ago. Denies new personal products. Some vaginal dryness. Burning when urine touches skin only. No STD concerns. Urinary symptoms of frequency, urgency or pain are not occurring.  History of coitus related UTI and has Macrobid on hand. No other health issues today.  Review of Systems  Skin:       Vaginal irritation, pain with intercourse    O:Healthy female WDWN Affect: normal, orientation x 3  Exam:Skin: warm and dry CVAT: negative bilateral Abdomen:soft, non tender, no masses, negative suprapubic pain  Inguinal Lymph nodes: no enlargement or tenderness Pelvic exam: External genital: normal female slight atrophic appearance BUS: negative Bladder, urethra, urethral meatus non tender Vagina: medication cream noted in posterior fornix, redness and slight atrophic appearance noted at entrance of vagina. Some redness/tenderness at introitus also. , Affirm taken Cervix: normal, non tender, no CMT Uterus: normal, non tender Adnexa:normal, non tender, no masses or fullness noted   A:Normal pelvic exam R/O vaginal infection Atrophic vaginitis with vaginal dryness noted Pain with sexual activity   P:Discussed findings of vaginal dryness and atrophic appearance and etiology. Discussed Aveeno  sitz bath for comfort in vaginal area. Discussed coconut or Olive oil for vaginal moisture with sexual activity. Also discussed using 3 times weekly or prn as needed for dryness, to see if this resolves her issues.. Avoid moist clothes  for extended period of time. Coconut Oil use for skin protection prior to vigorous activity can be  used to external skin for protection or dryness. Questions addressed. Patient will advised of lab results. Stop Monistat ovule use at this point. Lab: Affirm  Rv prn

## 2019-05-08 LAB — VAGINITIS/VAGINOSIS, DNA PROBE
Candida Species: NEGATIVE
Gardnerella vaginalis: NEGATIVE
Trichomonas vaginosis: NEGATIVE

## 2019-07-07 ENCOUNTER — Encounter: Payer: Self-pay | Admitting: Internal Medicine

## 2019-08-06 ENCOUNTER — Encounter: Payer: Self-pay | Admitting: Certified Nurse Midwife

## 2019-09-07 DIAGNOSIS — Z1211 Encounter for screening for malignant neoplasm of colon: Secondary | ICD-10-CM

## 2019-10-23 LAB — HM COLONOSCOPY

## 2020-06-12 ENCOUNTER — Ambulatory Visit: Payer: BC Managed Care – PPO

## 2020-07-21 ENCOUNTER — Other Ambulatory Visit (HOSPITAL_BASED_OUTPATIENT_CLINIC_OR_DEPARTMENT_OTHER)
Admission: RE | Admit: 2020-07-21 | Discharge: 2020-07-21 | Disposition: A | Discharge status: Home / Self Care | Payer: BC Managed Care – PPO | Source: Ambulatory Visit | Attending: Obstetrics & Gynecology | Admitting: Obstetrics & Gynecology

## 2020-07-21 ENCOUNTER — Other Ambulatory Visit: Payer: Self-pay

## 2020-07-21 ENCOUNTER — Ambulatory Visit (INDEPENDENT_AMBULATORY_CARE_PROVIDER_SITE_OTHER): Payer: BC Managed Care – PPO | Admitting: Obstetrics & Gynecology

## 2020-07-21 ENCOUNTER — Encounter (HOSPITAL_BASED_OUTPATIENT_CLINIC_OR_DEPARTMENT_OTHER): Payer: Self-pay | Admitting: Obstetrics & Gynecology

## 2020-07-21 ENCOUNTER — Other Ambulatory Visit (HOSPITAL_COMMUNITY)
Admission: RE | Admit: 2020-07-21 | Discharge: 2020-07-21 | Disposition: A | Discharge status: Home / Self Care | Payer: BC Managed Care – PPO | Source: Ambulatory Visit | Attending: Obstetrics & Gynecology | Admitting: Obstetrics & Gynecology

## 2020-07-21 VITALS — BP 150/95 | HR 112 | Ht 63.0 in | Wt 132.0 lb

## 2020-07-21 DIAGNOSIS — E538 Deficiency of other specified B group vitamins: Secondary | ICD-10-CM

## 2020-07-21 DIAGNOSIS — N390 Urinary tract infection, site not specified: Secondary | ICD-10-CM

## 2020-07-21 DIAGNOSIS — Z124 Encounter for screening for malignant neoplasm of cervix: Secondary | ICD-10-CM

## 2020-07-21 DIAGNOSIS — Z833 Family history of diabetes mellitus: Secondary | ICD-10-CM

## 2020-07-21 DIAGNOSIS — Z01419 Encounter for gynecological examination (general) (routine) without abnormal findings: Secondary | ICD-10-CM

## 2020-07-21 DIAGNOSIS — K120 Recurrent oral aphthae: Secondary | ICD-10-CM

## 2020-07-21 DIAGNOSIS — Z0000 Encounter for general adult medical examination without abnormal findings: Secondary | ICD-10-CM

## 2020-07-21 DIAGNOSIS — B370 Candidal stomatitis: Secondary | ICD-10-CM

## 2020-07-21 DIAGNOSIS — Z780 Asymptomatic menopausal state: Secondary | ICD-10-CM | POA: Diagnosis not present

## 2020-07-21 LAB — VITAMIN B12: Vitamin B-12: 340 pg/mL (ref 180–914)

## 2020-07-21 LAB — HEMOGLOBIN A1C
Hgb A1c MFr Bld: 5.3 % (ref 4.8–5.6)
Mean Plasma Glucose: 105.41 mg/dL

## 2020-07-21 LAB — TSH: TSH: 0.926 u[IU]/mL (ref 0.350–4.500)

## 2020-07-21 LAB — VITAMIN D 25 HYDROXY (VIT D DEFICIENCY, FRACTURES): Vit D, 25-Hydroxy: 32.55 ng/mL (ref 30–100)

## 2020-07-21 MED ORDER — NYSTATIN 100000 UNIT/ML MT SUSP: 5.0000 mL | Freq: Four times a day (QID) | OROMUCOSAL | 0 refills | Status: DC

## 2020-07-21 MED ORDER — NITROFURANTOIN MONOHYD MACRO 100 MG PO CAPS: ORAL_CAPSULE | ORAL | 3 refills | Status: DC

## 2020-07-21 MED ORDER — DEXAMETHASONE 0.5 MG/5ML PO ELIX: ORAL_SOLUTION | ORAL | 0 refills | Status: DC

## 2020-07-21 NOTE — Progress Notes (Signed)
60 y.o. G0P0000 Married White or Caucasian female here for annual exam.  Has been experiencing a lot of stressors.  She had shingles.  Now she thinks she has thrush.  Mouth is sore.  She has white on her tongue.  Doesn't hurt to swallow.  She was treated with valtrex for shingles.  She's having lingering neuralgia.     Her last day with work is next week.  She has a job with [employer] starting in April.  Denies vaginal bleeding.      Needs lab work done today.  Patient's last menstrual period was 10/11/2014 (exact date).          Sexually active: Yes.    The current method of family planning is post menopausal status.    Smoker:  no  Health Maintenance: Pap:  Obtained today History of abnormal Pap:  no MMG:  02/2019 Colonoscopy:  10/23/2019 BMD:   Not indicated yet.  D/w pt TDaP:  12/2013 Pneumonia vaccine(s):  Not indicated Shingrix:   completed Hep C testing: 11/2014 Screening Labs: today   reports that she has never smoked. She has never used smokeless tobacco. She reports that she does not drink alcohol and does not use drugs.  Past Medical History:  Diagnosis Date  . Fibroids    h/o   . GERD (gastroesophageal reflux disease)   . Rheumatic fever     History reviewed. No pertinent surgical history.  Current Outpatient Medications  Medication Sig Dispense Refill  . ibuprofen (MOTRIN IB) 200 MG tablet Take 1 tablet (200 mg total) by mouth every 6 (six) hours as needed for pain. 300 tablet 5  . nitrofurantoin, macrocrystal-monohydrate, (MACROBID) 100 MG capsule 1 po as needed for UTI prevention 30 capsule 3  . RESVERATROL PO Take 500mg  capsule by mouth daily    . Calcium-Vitamin D-Vitamin K (CHEWABLE CALCIUM PO) Take by mouth. (Patient not taking: Reported on 07/21/2020)    . cyanocobalamin (,VITAMIN B-12,) 1000 MCG/ML injection Inject 1 mL (1,000 mcg total) into the muscle every 7 (seven) days. (Patient not taking: Reported on 07/21/2020) 12 mL 4  . Syringe, Disposable, 3 ML MISC 1  Syringe by Does not apply route every 7 (seven) days. (Patient not taking: Reported on 07/21/2020) 100 each 2   No current facility-administered medications for this visit.    Family History  Problem Relation Age of Onset  . Cancer Mother        Adrenal  . Alcohol abuse Father   . Hyperlipidemia Father   . Hypertension Father   . Heart disease Maternal Grandfather   . Hypertension Maternal Grandfather   . Diabetes Brother     Review of Systems  Psychiatric/Behavioral:       Having significant stress with work  All other systems reviewed and are negative.   Exam:   BP (!) 150/95   Pulse (!) 112   Ht 5\' 3"  (1.6 m)   Wt 132 lb (59.9 kg)   LMP 10/11/2014 (Exact Date)   BMI 23.38 kg/m   Height: 5\' 3"  (160 cm)  General appearance: alert, cooperative and appears stated age Head: Normocephalic, without obvious abnormality, atraumatic Mouth:  Multiple aphthous ulcers noted, white adherent finding on tongue Neck: no adenopathy, supple, symmetrical, trachea midline and thyroid normal to inspection and palpation Lungs: clear to auscultation bilaterally Breasts: normal appearance, no masses or tenderness Heart: regular rate and rhythm Abdomen: soft, non-tender; bowel sounds normal; no masses,  no organomegaly Extremities: extremities normal, atraumatic, no  cyanosis or edema Skin: Skin color, texture, turgor normal. No rashes or lesions Lymph nodes: Cervical, supraclavicular, and axillary nodes normal. No abnormal inguinal nodes palpated Neurologic: Grossly normal   Pelvic: External genitalia:  no lesions              Urethra:  normal appearing urethra with no masses, tenderness or lesions              Bartholins and Skenes: normal                 Vagina: normal appearing vagina with normal color and discharge, no lesions              Cervix: no lesions              Pap taken: Yes.   Bimanual Exam:  Uterus:  normal size, contour, position, consistency, mobility, non-tender               Adnexa: normal adnexa and no mass, fullness, tenderness               Rectovaginal: Confirms               Anus:  normal sphincter tone, no lesions  Chaperone, Prince Rome, CMA, was present for exam.  Assessment/Plan: 1. Well woman exam with routine gynecological exam - pap smear obtained today - MMG is due.  Pt will schedule this at [facility]. - plan BMD prior to age 78 - colonoscopy done 10/2019 - lab work obtained today - vaccines up to date  2. Postmenopausal - no HRT  3. Dietary B12 deficiency - B12; Future  4. Recurrent UTI - nitrofurantoin, macrocrystal-monohydrate, (MACROBID) 100 MG capsule; 1 po as needed for UTI prevention  Dispense: 30 capsule; Refill: 3  5. Routine general medical examination at a health care facility - TSH; Future - VITAMIN D 25 Hydroxy (Vit-D Deficiency, Fractures); Future - Hemoglobin A1c; Future  6. Aphthous ulcer of mouth - dexamethasone 0.5 MG/5ML elixir; 80ml orally, swish and spit, 3-4 times daily.  Dispense: 237 mL; Refill: 0  7. Oral thrush - nystatin (MYCOSTATIN) 100000 UNIT/ML suspension; Take 5 mLs (500,000 Units total) by mouth 4 (four) times daily.  Dispense: 280 mL; Refill: 0

## 2020-07-23 LAB — CYTOLOGY - PAP
Comment: NEGATIVE
Diagnosis: NEGATIVE
Diagnosis: REACTIVE
High risk HPV: NEGATIVE

## 2020-07-28 ENCOUNTER — Other Ambulatory Visit: Payer: Self-pay | Admitting: Obstetrics & Gynecology

## 2020-07-28 ENCOUNTER — Other Ambulatory Visit (HOSPITAL_BASED_OUTPATIENT_CLINIC_OR_DEPARTMENT_OTHER): Payer: Self-pay | Admitting: Obstetrics & Gynecology

## 2020-07-28 DIAGNOSIS — Z1231 Encounter for screening mammogram for malignant neoplasm of breast: Secondary | ICD-10-CM

## 2020-07-31 ENCOUNTER — Other Ambulatory Visit (HOSPITAL_BASED_OUTPATIENT_CLINIC_OR_DEPARTMENT_OTHER): Payer: Self-pay | Admitting: Obstetrics & Gynecology

## 2020-07-31 ENCOUNTER — Encounter (HOSPITAL_BASED_OUTPATIENT_CLINIC_OR_DEPARTMENT_OTHER): Payer: Self-pay

## 2020-07-31 MED ORDER — ESTRADIOL 0.1 MG/GM VA CREA: TOPICAL_CREAM | VAGINAL | 6 refills | Status: DC

## 2020-08-01 ENCOUNTER — Ambulatory Visit
Admission: RE | Admit: 2020-08-01 | Discharge: 2020-08-01 | Disposition: A | Discharge status: Home / Self Care | Payer: BC Managed Care – PPO | Source: Ambulatory Visit

## 2020-08-01 ENCOUNTER — Other Ambulatory Visit: Payer: Self-pay

## 2020-08-01 DIAGNOSIS — Z1231 Encounter for screening mammogram for malignant neoplasm of breast: Secondary | ICD-10-CM

## 2020-08-04 ENCOUNTER — Other Ambulatory Visit (HOSPITAL_BASED_OUTPATIENT_CLINIC_OR_DEPARTMENT_OTHER): Payer: Self-pay | Admitting: Obstetrics & Gynecology

## 2020-08-04 DIAGNOSIS — Z1231 Encounter for screening mammogram for malignant neoplasm of breast: Secondary | ICD-10-CM

## 2020-11-06 ENCOUNTER — Encounter (HOSPITAL_BASED_OUTPATIENT_CLINIC_OR_DEPARTMENT_OTHER): Payer: Self-pay

## 2020-11-06 NOTE — Telephone Encounter (Signed)
Called and spoke with patient to let her know that we have received her message as well as the prescription request form. I have informed her that Dr. Sabra Heck is out of the office this week and will return on Monday. Patient states she is perfectly ok with that. Tbw   I have placed the Rx form on your desk for a signature. tbw

## 2021-06-29 ENCOUNTER — Other Ambulatory Visit (HOSPITAL_BASED_OUTPATIENT_CLINIC_OR_DEPARTMENT_OTHER): Payer: Self-pay | Admitting: Obstetrics & Gynecology

## 2021-06-29 DIAGNOSIS — Z1231 Encounter for screening mammogram for malignant neoplasm of breast: Secondary | ICD-10-CM

## 2021-07-09 ENCOUNTER — Other Ambulatory Visit: Payer: Self-pay | Admitting: Obstetrics & Gynecology

## 2021-07-09 DIAGNOSIS — Z1231 Encounter for screening mammogram for malignant neoplasm of breast: Secondary | ICD-10-CM

## 2021-07-24 ENCOUNTER — Ambulatory Visit (HOSPITAL_BASED_OUTPATIENT_CLINIC_OR_DEPARTMENT_OTHER): Payer: BC Managed Care – PPO | Admitting: Obstetrics & Gynecology

## 2021-08-03 ENCOUNTER — Ambulatory Visit (HOSPITAL_BASED_OUTPATIENT_CLINIC_OR_DEPARTMENT_OTHER): Payer: BC Managed Care – PPO | Admitting: Obstetrics & Gynecology

## 2021-08-03 ENCOUNTER — Ambulatory Visit (HOSPITAL_BASED_OUTPATIENT_CLINIC_OR_DEPARTMENT_OTHER): Payer: BC Managed Care – PPO | Admitting: Radiology

## 2021-09-01 ENCOUNTER — Ambulatory Visit (HOSPITAL_BASED_OUTPATIENT_CLINIC_OR_DEPARTMENT_OTHER): Payer: BC Managed Care – PPO | Admitting: Radiology

## 2021-09-02 ENCOUNTER — Ambulatory Visit (HOSPITAL_BASED_OUTPATIENT_CLINIC_OR_DEPARTMENT_OTHER): Payer: BC Managed Care – PPO | Admitting: Obstetrics & Gynecology

## 2021-09-02 ENCOUNTER — Encounter (HOSPITAL_BASED_OUTPATIENT_CLINIC_OR_DEPARTMENT_OTHER): Payer: Self-pay | Admitting: Obstetrics & Gynecology

## 2021-09-02 ENCOUNTER — Ambulatory Visit (INDEPENDENT_AMBULATORY_CARE_PROVIDER_SITE_OTHER): Payer: 59 | Admitting: Obstetrics & Gynecology

## 2021-09-02 ENCOUNTER — Ambulatory Visit
Admission: RE | Admit: 2021-09-02 | Discharge: 2021-09-02 | Disposition: A | Discharge status: Home / Self Care | Payer: 59 | Source: Ambulatory Visit | Attending: Obstetrics & Gynecology | Admitting: Obstetrics & Gynecology

## 2021-09-02 VITALS — BP 150/85 | HR 72 | Ht 63.0 in | Wt 139.8 lb

## 2021-09-02 DIAGNOSIS — Z780 Asymptomatic menopausal state: Secondary | ICD-10-CM

## 2021-09-02 DIAGNOSIS — E538 Deficiency of other specified B group vitamins: Secondary | ICD-10-CM

## 2021-09-02 DIAGNOSIS — R030 Elevated blood-pressure reading, without diagnosis of hypertension: Secondary | ICD-10-CM

## 2021-09-02 DIAGNOSIS — Z01419 Encounter for gynecological examination (general) (routine) without abnormal findings: Secondary | ICD-10-CM

## 2021-09-02 DIAGNOSIS — N390 Urinary tract infection, site not specified: Secondary | ICD-10-CM | POA: Diagnosis not present

## 2021-09-02 DIAGNOSIS — Z1231 Encounter for screening mammogram for malignant neoplasm of breast: Secondary | ICD-10-CM

## 2021-09-02 MED ORDER — ESTRADIOL 0.1 MG/GM VA CREA: TOPICAL_CREAM | VAGINAL | 3 refills | Status: DC

## 2021-09-02 MED ORDER — NITROFURANTOIN MONOHYD MACRO 100 MG PO CAPS: ORAL_CAPSULE | ORAL | 1 refills | Status: DC

## 2021-09-02 MED ORDER — CYANOCOBALAMIN 1000 MCG/ML IJ SOLN: 1000.0000 ug | INTRAMUSCULAR | 4 refills | Status: DC

## 2021-09-02 NOTE — Progress Notes (Signed)
61 y.o. G0P0000 Married White or Caucasian female here for annual exam.  Doing well.  Denies vaginal bleeding.   ? ?BP is elevated today.  Pt watches at home.  Reports it's mostly 120s/70s.   ? ?Patient's last menstrual period was 10/11/2014 (exact date).          ?Sexually active: Yes.    ?The current method of family planning is post menopausal status.    ?Exercising: Yes.     ?Smoker:  no ? ?Health Maintenance: ?Pap:  07/21/2020 Negative ?History of abnormal Pap:  no ?MMG:  09/02/2021 neg ?Colonoscopy:  10/23/2019, follow up 10 years ?BMD:   plan next year ?Screening Labs: done last year ? ? reports that she has never smoked. She has never used smokeless tobacco. She reports that she does not drink alcohol and does not use drugs. ? ?Past Medical History:  ?Diagnosis Date  ? Fibroids   ? h/o   ? GERD (gastroesophageal reflux disease)   ? Rheumatic fever   ? ? ?No past surgical history on file. ? ?Current Outpatient Medications  ?Medication Sig Dispense Refill  ? Calcium-Vitamin D-Vitamin K (CHEWABLE CALCIUM PO) Take by mouth.    ? ibuprofen (MOTRIN IB) 200 MG tablet Take 1 tablet (200 mg total) by mouth every 6 (six) hours as needed for pain. 300 tablet 5  ? RESVERATROL PO Take '500mg'$  capsule by mouth daily    ? Syringe, Disposable, 3 ML MISC 1 Syringe by Does not apply route every 7 (seven) days. 100 each 2  ? cyanocobalamin (,VITAMIN B-12,) 1000 MCG/ML injection Inject 1 mL (1,000 mcg total) into the muscle every 30 (thirty) days. 3 mL 4  ? estradiol (ESTRACE) 0.1 MG/GM vaginal cream 1 gram vaginally twice weekly 42.5 g 3  ? nitrofurantoin, macrocrystal-monohydrate, (MACROBID) 100 MG capsule 1 po as needed for UTI prevention 30 capsule 1  ? ?No current facility-administered medications for this visit.  ? ? ?Family History  ?Problem Relation Age of Onset  ? Cancer Mother   ?     Adrenal  ? Alcohol abuse Father   ? Hyperlipidemia Father   ? Hypertension Father   ? Heart disease Maternal Grandfather   ? Hypertension  Maternal Grandfather   ? Diabetes Brother   ? Breast cancer Neg Hx   ? ? ?Review of Systems  ?All other systems reviewed and are negative. ? ?Exam:   ?BP (!) 150/85 (BP Location: Right Arm, Patient Position: Sitting, Cuff Size: Normal)   Pulse 72   Ht '5\' 3"'$  (1.6 m) Comment: reported  Wt 139 lb 12.8 oz (63.4 kg)   LMP 10/11/2014 (Exact Date)   BMI 24.76 kg/m?   Height: '5\' 3"'$  (160 cm) (reported) ? ?General appearance: alert, cooperative and appears stated age ?Head: Normocephalic, without obvious abnormality, atraumatic ?Neck: no adenopathy, supple, symmetrical, trachea midline and thyroid normal to inspection and palpation ?Lungs: clear to auscultation bilaterally ?Breasts: normal appearance, no masses or tenderness ?Heart: regular rate and rhythm ?Abdomen: soft, non-tender; bowel sounds normal; no masses,  no organomegaly ?Extremities: extremities normal, atraumatic, no cyanosis or edema ?Skin: Skin color, texture, turgor normal. No rashes or lesions ?Lymph nodes: Cervical, supraclavicular, and axillary nodes normal. ?No abnormal inguinal nodes palpated ?Neurologic: Grossly normal ? ? ?Pelvic: External genitalia:  no lesions ?             Urethra:  normal appearing urethra with no masses, tenderness or lesions ?  Bartholins and Skenes: normal    ?             Vagina: normal appearing vagina with normal color and no discharge, no lesions ?             Cervix: no lesions ?             Pap taken: No. ?Bimanual Exam:  Uterus:  normal size, contour, position, consistency, mobility, non-tender ?             Adnexa: normal adnexa and no mass, fullness, tenderness ?              Rectovaginal: Confirms ?              Anus:  normal sphincter tone, no lesions ? ?Chaperone, Octaviano Batty, CMA, was present for exam. ? ?Assessment/Plan: ?1. Well woman exam with routine gynecological exam ?- pap neg with neg HR HPV 2022.  Not indicated today. ?- MMG Normal today ?- colonoscopy 2022 ?- BMD ordered for next year ?-  vaccines reviewed/updated ?- lab work done 01/2019 ? ?2. Recurrent UTI ?- nitrofurantoin, macrocrystal-monohydrate, (MACROBID) 100 MG capsule; 1 po as needed for UTI prevention  Dispense: 30 capsule; Refill: 1 ?- estradiol (ESTRACE) 0.1 MG/GM vaginal cream; 1 gram vaginally twice weekly  Dispense: 42.5 g; Refill: 3 ? ?3. Vitamin B 12 deficiency ?- rx for 1012mg [place] monthly to pharamcy ? ?4. Postmenopausal ?- no oral HRT ? ?5. Elevated blood pressure reading ?- pt is monitoring at home ? ? ?

## 2021-11-06 ENCOUNTER — Encounter (HOSPITAL_BASED_OUTPATIENT_CLINIC_OR_DEPARTMENT_OTHER): Payer: Self-pay | Admitting: Obstetrics & Gynecology

## 2021-11-06 ENCOUNTER — Ambulatory Visit (INDEPENDENT_AMBULATORY_CARE_PROVIDER_SITE_OTHER): Payer: 59 | Admitting: Obstetrics & Gynecology

## 2021-11-06 ENCOUNTER — Other Ambulatory Visit (HOSPITAL_COMMUNITY)
Admission: RE | Admit: 2021-11-06 | Discharge: 2021-11-06 | Disposition: A | Discharge status: Home / Self Care | Payer: 59 | Source: Ambulatory Visit | Attending: Obstetrics & Gynecology | Admitting: Obstetrics & Gynecology

## 2021-11-06 VITALS — BP 145/81 | HR 76 | Ht 63.0 in | Wt 138.4 lb

## 2021-11-06 DIAGNOSIS — L292 Pruritus vulvae: Secondary | ICD-10-CM | POA: Diagnosis present

## 2021-11-06 MED ORDER — NYSTATIN 100000 UNIT/GM EX CREA: 1.0000 | TOPICAL_CREAM | Freq: Two times a day (BID) | CUTANEOUS | 0 refills | Status: DC

## 2021-11-06 MED ORDER — FLUCONAZOLE 150 MG PO TABS: 150.0000 mg | ORAL_TABLET | Freq: Once | ORAL | 0 refills | Status: AC

## 2021-11-09 LAB — CERVICOVAGINAL ANCILLARY ONLY
Candida Glabrata: NEGATIVE
Candida Vaginitis: POSITIVE — AB
Comment: NEGATIVE
Comment: NEGATIVE

## 2021-11-11 ENCOUNTER — Encounter (HOSPITAL_BASED_OUTPATIENT_CLINIC_OR_DEPARTMENT_OTHER): Payer: Self-pay | Admitting: Obstetrics & Gynecology

## 2021-11-11 ENCOUNTER — Other Ambulatory Visit (HOSPITAL_BASED_OUTPATIENT_CLINIC_OR_DEPARTMENT_OTHER): Payer: Self-pay | Admitting: Obstetrics & Gynecology

## 2021-11-11 MED ORDER — TERCONAZOLE 0.4 % VA CREA: 1.0000 | TOPICAL_CREAM | Freq: Every day | VAGINAL | 0 refills | Status: DC

## 2022-04-28 NOTE — Telephone Encounter (Signed)
Med DC'd

## 2022-05-01 IMAGING — MG MM DIGITAL SCREENING BILAT W/ TOMO AND CAD
8 series · 9 of 24 positions shown · non-contrast
Comparison: Previous exam(s).

CLINICAL DATA: Screening.

EXAM:
DIGITAL SCREENING BILATERAL MAMMOGRAM WITH TOMOSYNTHESIS AND CAD
TECHNIQUE: Bilateral screening digital craniocaudal and mediolateral oblique
mammograms were obtained. Bilateral screening digital breast
tomosynthesis was performed. The images were evaluated with
computer-aided detection.

[L CC synth-2D]
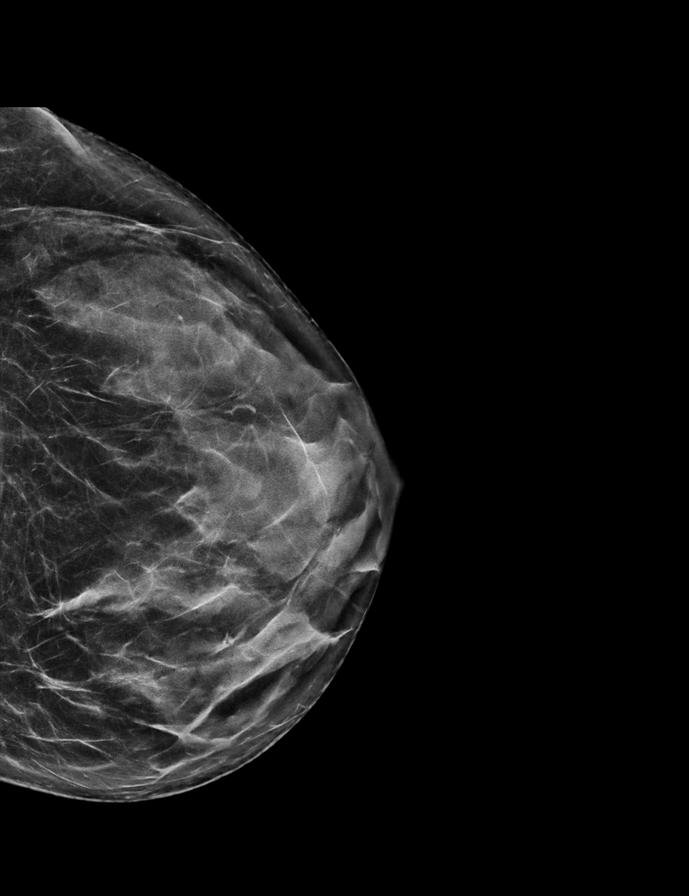

[R MLO synth-2D]
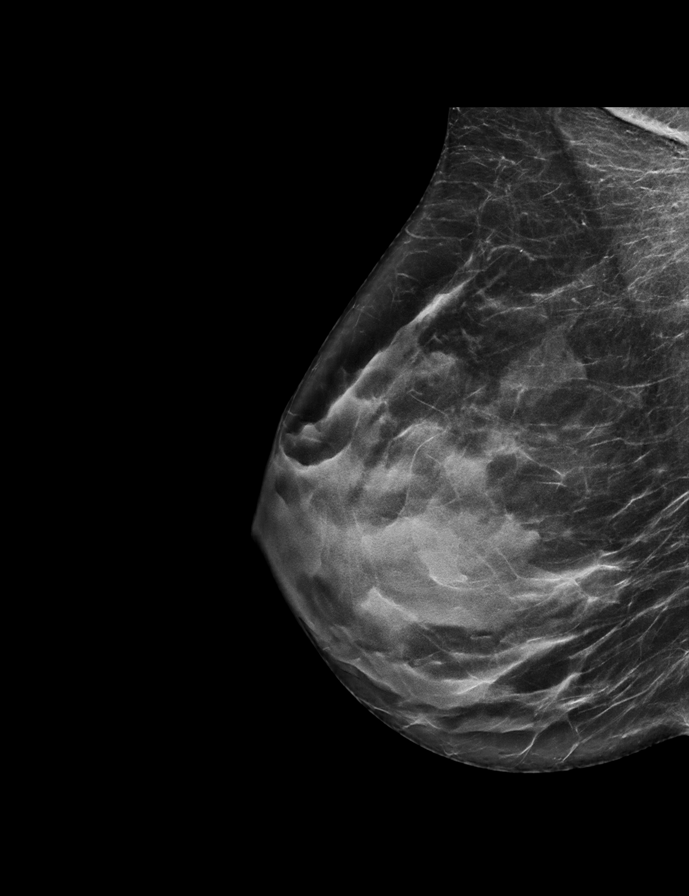

[R CC synth-2D]
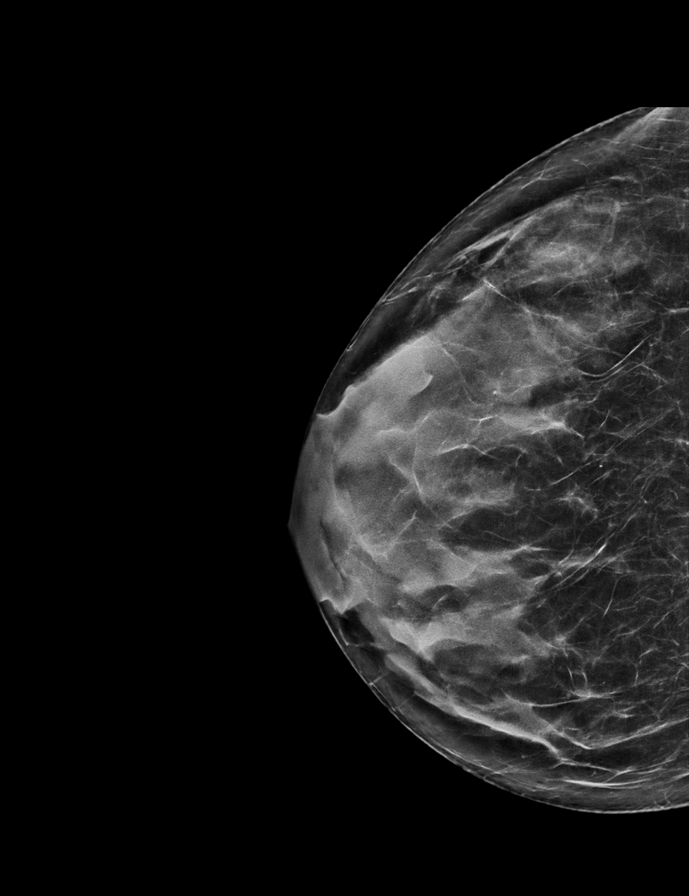

[L MLO synth-2D]
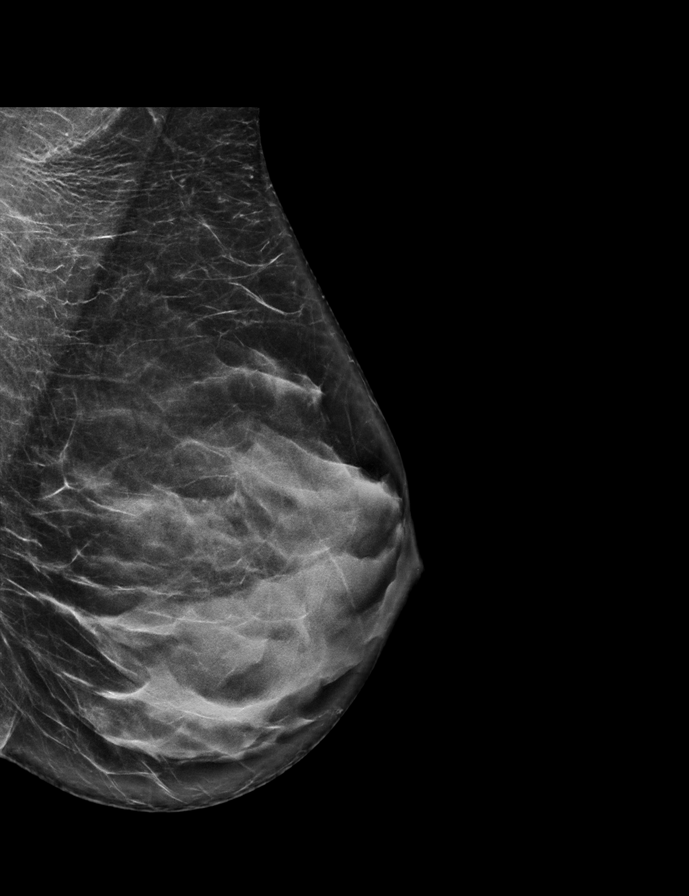

[R CC tomo · 2 of 62 frames shown]
[frame 21/62]
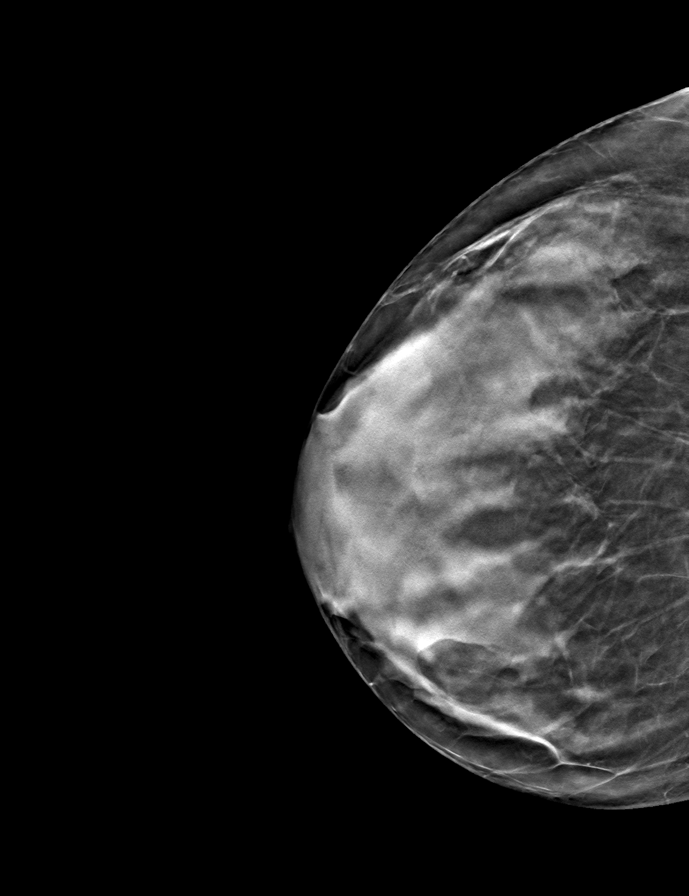
[frame 31/62]
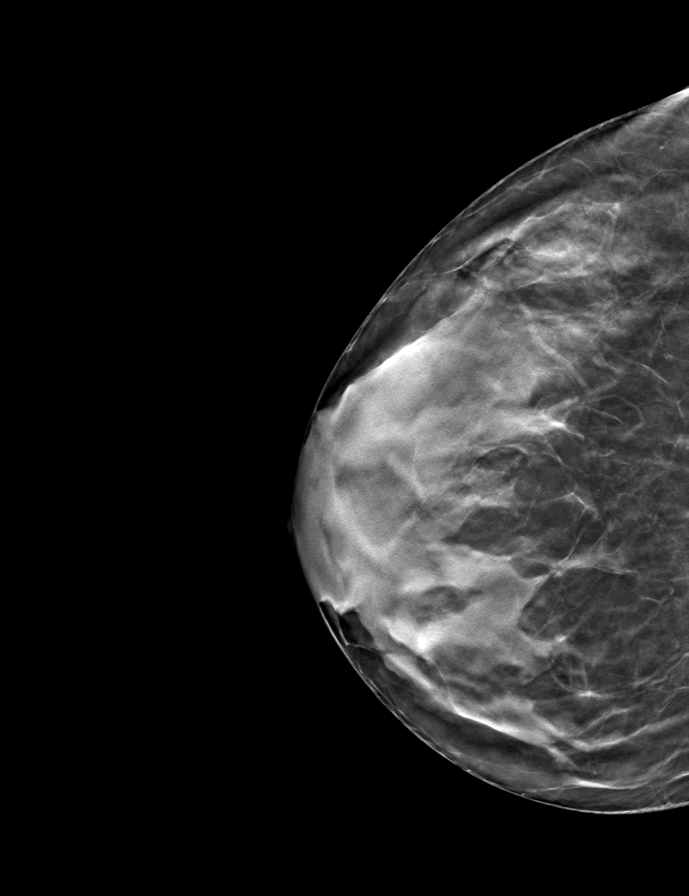

[R MLO tomo · tomo slice 33/65.0]
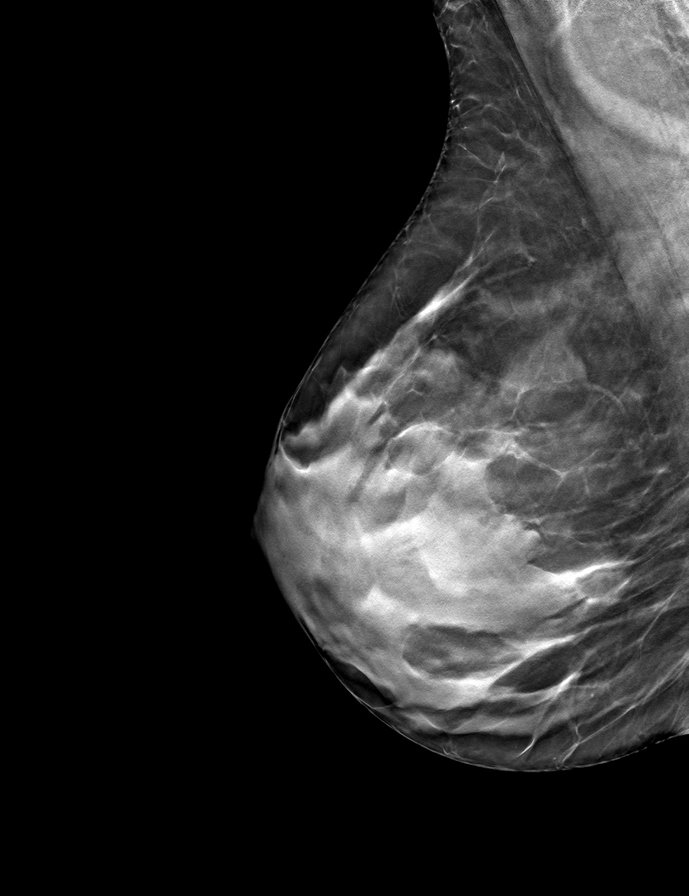

[L MLO tomo · tomo slice 33/66.0]
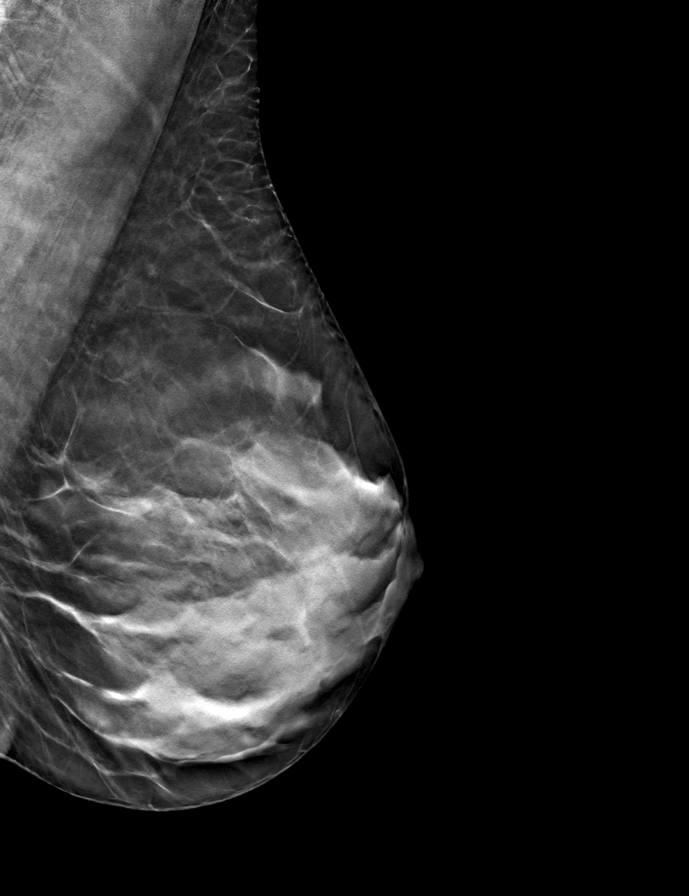

[L CC tomo · tomo slice 32/63.0]
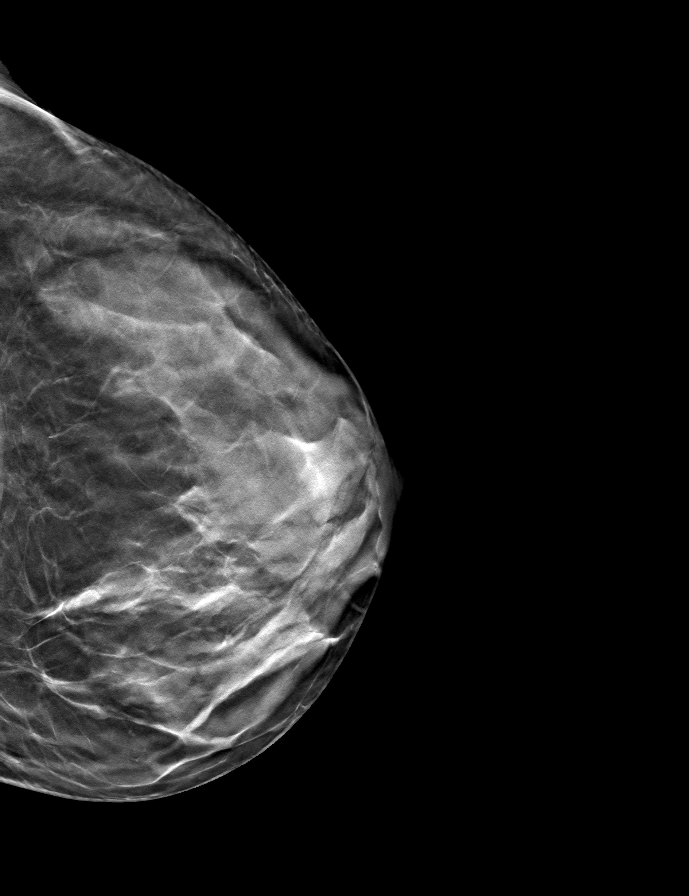

[9 of 24 positions shown; findings below may reference images not displayed]

ACR Breast Density Category d: The breast tissue is extremely dense,
which lowers the sensitivity of mammography
FINDINGS: There are no findings suspicious for malignancy.
IMPRESSION: No mammographic evidence of malignancy. A result letter of this
screening mammogram will be mailed directly to the patient.

RECOMMENDATION:
Screening mammogram in one year. (Code:TA-[id number])

BI-RADS CATEGORY  1: Negative.

## 2022-07-07 ENCOUNTER — Other Ambulatory Visit (HOSPITAL_BASED_OUTPATIENT_CLINIC_OR_DEPARTMENT_OTHER): Payer: Self-pay | Admitting: Obstetrics & Gynecology

## 2022-07-07 ENCOUNTER — Encounter (HOSPITAL_BASED_OUTPATIENT_CLINIC_OR_DEPARTMENT_OTHER): Payer: Self-pay | Admitting: Obstetrics & Gynecology

## 2022-07-07 DIAGNOSIS — Z13820 Encounter for screening for osteoporosis: Secondary | ICD-10-CM

## 2022-07-21 ENCOUNTER — Encounter: Payer: Self-pay | Admitting: Obstetrics & Gynecology

## 2022-07-21 DIAGNOSIS — Z1231 Encounter for screening mammogram for malignant neoplasm of breast: Secondary | ICD-10-CM

## 2022-07-26 ENCOUNTER — Other Ambulatory Visit (HOSPITAL_BASED_OUTPATIENT_CLINIC_OR_DEPARTMENT_OTHER): Payer: Self-pay | Admitting: Obstetrics & Gynecology

## 2022-07-26 DIAGNOSIS — Z13820 Encounter for screening for osteoporosis: Secondary | ICD-10-CM

## 2022-07-26 DIAGNOSIS — Z1231 Encounter for screening mammogram for malignant neoplasm of breast: Secondary | ICD-10-CM

## 2022-08-16 ENCOUNTER — Other Ambulatory Visit (HOSPITAL_BASED_OUTPATIENT_CLINIC_OR_DEPARTMENT_OTHER): Payer: Self-pay | Admitting: *Deleted

## 2022-08-16 DIAGNOSIS — Z1231 Encounter for screening mammogram for malignant neoplasm of breast: Secondary | ICD-10-CM

## 2022-08-16 DIAGNOSIS — Z13820 Encounter for screening for osteoporosis: Secondary | ICD-10-CM

## 2022-09-09 ENCOUNTER — Other Ambulatory Visit (HOSPITAL_BASED_OUTPATIENT_CLINIC_OR_DEPARTMENT_OTHER): Payer: Self-pay | Admitting: *Deleted

## 2022-09-09 ENCOUNTER — Ambulatory Visit (INDEPENDENT_AMBULATORY_CARE_PROVIDER_SITE_OTHER): Payer: PRIVATE HEALTH INSURANCE | Admitting: Obstetrics & Gynecology

## 2022-09-09 ENCOUNTER — Encounter (HOSPITAL_BASED_OUTPATIENT_CLINIC_OR_DEPARTMENT_OTHER): Payer: Self-pay | Admitting: Obstetrics & Gynecology

## 2022-09-09 VITALS — BP 115/77 | HR 74 | Ht 64.0 in | Wt 140.6 lb

## 2022-09-09 DIAGNOSIS — L292 Pruritus vulvae: Secondary | ICD-10-CM

## 2022-09-09 DIAGNOSIS — Z01419 Encounter for gynecological examination (general) (routine) without abnormal findings: Secondary | ICD-10-CM | POA: Diagnosis not present

## 2022-09-09 DIAGNOSIS — N390 Urinary tract infection, site not specified: Secondary | ICD-10-CM

## 2022-09-09 DIAGNOSIS — E538 Deficiency of other specified B group vitamins: Secondary | ICD-10-CM

## 2022-09-09 MED ORDER — TRIAMCINOLONE ACETONIDE 0.5 % EX OINT: 1.0000 | TOPICAL_OINTMENT | Freq: Two times a day (BID) | CUTANEOUS | 1 refills | Status: AC

## 2022-09-09 MED ORDER — NITROFURANTOIN MONOHYD MACRO 100 MG PO CAPS: ORAL_CAPSULE | ORAL | 1 refills | Status: DC

## 2022-09-09 MED ORDER — ESTRADIOL 0.1 MG/GM VA CREA: TOPICAL_CREAM | VAGINAL | 3 refills | Status: DC

## 2022-09-09 MED ORDER — TRIAMCINOLONE ACETONIDE 0.5 % EX OINT: 1.0000 | TOPICAL_OINTMENT | Freq: Two times a day (BID) | CUTANEOUS | 1 refills | Status: DC

## 2022-09-09 NOTE — Progress Notes (Signed)
62 y.o. G0P0000 Married White or Caucasian female here for annual exam.  Denies vaginal bleeding.  Is using vaginal estrogen cream to help with vaginal tissue changes.  Has recurrent UTIs and does need macrobid refills.  BMD reviewed with pt.  Calcium discussed.  She is going to repeat in 3-4 years.  No medication recommended.  Patient's last menstrual period was 05/17/2013 (approximate).          Sexually active: Yes.    The current method of family planning is post menopausal status.    Exercising: Yes.     Smoker:  no  Health Maintenance: Pap:  07/21/2020 Negative History of abnormal Pap:  no MMG:  08/04/2022 Negative Colonoscopy:  10/23/2019, follow up 10 years BMD:   08/04/2022 Osteopenia Screening Labs: 2022, will repeat next year   reports that she has never smoked. She has never used smokeless tobacco. She reports that she does not drink alcohol and does not use drugs.  Past Medical History:  Diagnosis Date   Fibroids    h/o    GERD (gastroesophageal reflux disease)    Rheumatic fever     No past surgical history on file.  Current Outpatient Medications  Medication Sig Dispense Refill   Calcium-Vitamin D-Vitamin K (CHEWABLE CALCIUM PO) Take by mouth.     cyanocobalamin (,VITAMIN B-12,) 1000 MCG/ML injection Inject 1 mL (1,000 mcg total) into the muscle every 30 (thirty) days. 3 mL 4   ibuprofen (MOTRIN IB) 200 MG tablet Take 1 tablet (200 mg total) by mouth every 6 (six) hours as needed for pain. 300 tablet 5   Syringe, Disposable, 3 ML MISC 1 Syringe by Does not apply route every 7 (seven) days. 100 each 2   estradiol (ESTRACE) 0.1 MG/GM vaginal cream 1 gram vaginally twice weekly 42.5 g 3   nitrofurantoin, macrocrystal-monohydrate, (MACROBID) 100 MG capsule 1 po as needed for UTI prevention 30 capsule 1   No current facility-administered medications for this visit.    Family History  Problem Relation Age of Onset   Cancer Mother        Adrenal   Alcohol abuse  Father    Hyperlipidemia Father    Hypertension Father    Heart disease Maternal Grandfather    Hypertension Maternal Grandfather    Diabetes Brother    Breast cancer Neg Hx     ROS: Constitutional: negative Genitourinary:negative  Exam:   BP 115/77 (BP Location: Left Arm, Patient Position: Sitting, Cuff Size: Large)   Pulse 74   Ht  (1.626 m) Comment: Reported  Wt 140 lb 9.6 oz (63.8 kg)   LMP 05/17/2013 (Approximate)   BMI 24.13 kg/m   Height:  (162.6 cm) (Reported)  General appearance: alert, cooperative and appears stated age Head: Normocephalic, without obvious abnormality, atraumatic Neck: no adenopathy, supple, symmetrical, trachea midline and thyroid normal to inspection and palpation Lungs: clear to auscultation bilaterally Breasts: normal appearance, no masses or tenderness Heart: regular rate and rhythm Abdomen: soft, non-tender; bowel sounds normal; no masses,  no organomegaly Extremities: extremities normal, atraumatic, no cyanosis or edema Skin: Skin color, texture, turgor normal. No rashes or lesions Lymph nodes: Cervical, supraclavicular, and axillary nodes normal. No abnormal inguinal nodes palpated Neurologic: Grossly normal   Pelvic: External genitalia:  mild erythema above the clitoral hood, no discrete lesion              Urethra:  normal appearing urethra with no masses, tenderness or lesions  Bartholins and Skenes: normal                 Vagina: normal appearing vagina with normal color and no discharge, no lesions              Cervix: no lesions              Pap taken: No. Bimanual Exam:  Uterus:  normal size, contour, position, consistency, mobility, non-tender              Adnexa: normal adnexa and no mass, fullness, tenderness               Rectovaginal: Confirms               Anus:  normal sphincter tone, no lesions  Chaperone, Ina Homes, CMA, was present for exam.  Assessment/Plan: 1. Well woman exam with routine  gynecological exam - Pap smear 07/2020 - Mammogram up to date - Colonoscopy 2021, follow up 10 years - Bone mineral density last year.  Plan to repeat around age 44 - lab work done will be done next year - vaccines reviewed/updated  2. Recurrent UTI - estradiol (ESTRACE) 0.1 MG/GM vaginal cream; 1 gram vaginally twice weekly  Dispense: 42.5 g; Refill: 3 - nitrofurantoin, macrocrystal-monohydrate, (MACROBID) 100 MG capsule; 1 po as needed for UTI prevention  Dispense: 30 capsule; Refill: 1  3. Dietary B12 deficiency - on supplement  4. Vulvar itching - triamcinolone ointment (KENALOG) 0.5 %; Apply 1 Application topically 2 (two) times daily. Use bid for 7-10 days.  Then can use as needed.  Dispense: 30 g; Refill: 1

## 2022-09-09 NOTE — Progress Notes (Signed)
Pt states that Rx is out of stock at [facility] and asks that it be re sent to [facility]

## 2022-12-02 ENCOUNTER — Other Ambulatory Visit (HOSPITAL_BASED_OUTPATIENT_CLINIC_OR_DEPARTMENT_OTHER): Payer: Self-pay | Admitting: Obstetrics & Gynecology

## 2023-09-12 ENCOUNTER — Ambulatory Visit (HOSPITAL_BASED_OUTPATIENT_CLINIC_OR_DEPARTMENT_OTHER): Payer: Self-pay | Admitting: Obstetrics & Gynecology

## 2023-10-12 ENCOUNTER — Other Ambulatory Visit (HOSPITAL_COMMUNITY)
Admission: RE | Admit: 2023-10-12 | Discharge: 2023-10-12 | Disposition: A | Discharge status: Home / Self Care | Payer: PRIVATE HEALTH INSURANCE | Source: Ambulatory Visit | Attending: Obstetrics & Gynecology | Admitting: Obstetrics & Gynecology

## 2023-10-12 ENCOUNTER — Ambulatory Visit (HOSPITAL_BASED_OUTPATIENT_CLINIC_OR_DEPARTMENT_OTHER): Payer: PRIVATE HEALTH INSURANCE | Admitting: Obstetrics & Gynecology

## 2023-10-12 ENCOUNTER — Encounter (HOSPITAL_BASED_OUTPATIENT_CLINIC_OR_DEPARTMENT_OTHER): Payer: Self-pay | Admitting: Obstetrics & Gynecology

## 2023-10-12 VITALS — BP 155/78 | HR 75 | Ht 64.0 in | Wt 135.2 lb

## 2023-10-12 DIAGNOSIS — N390 Urinary tract infection, site not specified: Secondary | ICD-10-CM

## 2023-10-12 DIAGNOSIS — E7800 Pure hypercholesterolemia, unspecified: Secondary | ICD-10-CM

## 2023-10-12 DIAGNOSIS — Z124 Encounter for screening for malignant neoplasm of cervix: Secondary | ICD-10-CM

## 2023-10-12 DIAGNOSIS — E538 Deficiency of other specified B group vitamins: Secondary | ICD-10-CM

## 2023-10-12 DIAGNOSIS — Z01419 Encounter for gynecological examination (general) (routine) without abnormal findings: Secondary | ICD-10-CM

## 2023-10-12 DIAGNOSIS — Z01411 Encounter for gynecological examination (general) (routine) with abnormal findings: Secondary | ICD-10-CM | POA: Diagnosis not present

## 2023-10-12 MED ORDER — ESTRADIOL 0.1 MG/GM VA CREA: TOPICAL_CREAM | VAGINAL | 3 refills | Status: AC

## 2023-10-12 MED ORDER — NITROFURANTOIN MONOHYD MACRO 100 MG PO CAPS: ORAL_CAPSULE | ORAL | 1 refills | Status: AC

## 2023-10-12 NOTE — Progress Notes (Signed)
 ANNUAL EXAM Patient name: Lauren Bell MRN [id number]  Date of birth: Aug 12, 1960 Chief Complaint:   AEX, denies vaginal bleeding  History of Present Illness:   Lauren Bell is a 63 y.o. G0P0000 Caucasian female being seen today for a routine annual exam.  She quit her job in February.    Denies vaginal bleeding.    Patient's last menstrual period was 05/17/2013 (approximate).   Last pap  07/21/2020. Results were: NILM w/ HRHPV negative. H/O abnormal pap: no Last mammogram: 08/04/2022 . Results were: normal. Family h/o breast cancer: no Last colonoscopy: 10/23/2019. Results were: normal. Family h/o colorectal cancer: no DEXA:  08/16/2022, osteopenia     09/09/2022    8:53 AM 11/06/2021    9:50 AM 09/02/2021    9:05 AM 01/24/2019   10:20 AM 03/10/2017   11:21 AM  Depression screen PHQ 2/9  Decreased Interest 0 0 0 0 0  Down, Depressed, Hopeless 0 0 0 0 0  PHQ - 2 Score 0 0 0 0 0  Altered sleeping    0 0  Tired, decreased energy    0 0  Change in appetite    0 0  Feeling bad or failure about yourself     0 0  Trouble concentrating    0 0  Moving slowly or fidgety/restless    0 0  Suicidal thoughts    0 0  PHQ-9 Score    0 0  Difficult doing work/chores    Not difficult at all Not difficult at all    Review of Systems:   Pertinent items are noted in HPI Denies any bladder issues or bowel changes. Pertinent History Reviewed:  Reviewed past medical,surgical, social and family history.  Reviewed problem list, medications and allergies. Physical Assessment:   Vitals:   10/12/23 1020  Weight: 135 lb 3.2 oz (61.3 kg)  Height: 5\' 4"  (1.626 m)  Body mass index is 23.21 kg/m.        Physical Examination:   General appearance - well appearing, and in no distress  Mental status - alert, oriented to person, place, and time  Psych:  She has a normal mood and affect  Skin - warm and dry, normal color, no suspicious lesions noted  Chest - effort normal, all lung fields  clear to auscultation bilaterally  Heart - normal rate and regular rhythm  Neck:  midline trachea, no thyromegaly or nodules  Breasts - breasts appear normal, no suspicious masses, no skin or nipple changes or axillary nodes  Abdomen - soft, nontender, nondistended, no masses or organomegaly  Pelvic - VULVA: normal appearing vulva with no masses, tenderness or lesions   VAGINA: normal appearing vagina with normal color and discharge, no lesions   CERVIX: normal appearing cervix without discharge or lesions, no CMT  Thin prep pap is done with HR HPV cotesting  UTERUS: uterus is felt to be normal size, shape, consistency and nontender   ADNEXA: No adnexal masses or tenderness noted.  Rectal - normal rectal, good sphincter tone, no masses felt.    Extremities:  No swelling or varicosities noted  Chaperone present for exam  Assessment & Plan:  1. Well woman exam with routine gynecological exam (Primary) - Pap smear updated - Mammogram 08/04/2022 - Colonoscopy 2021.  Follow up 10 years. - Bone mineral density 08/04/2022 - lab work ordered today - vaccines reviewed/updated  2. Recurrent UTI - estradiol  (ESTRACE ) 0.1 MG/GM vaginal cream; 1 gram vaginally twice weekly  Dispense: 42.5 g; Refill: 3 - nitrofurantoin , macrocrystal-monohydrate, (MACROBID ) 100 MG capsule; 1 po as needed for UTI prevention  Dispense: 30 capsule; Refill: 1  3. Elevated cholesterol - Hemoglobin A1c - Lipid panel  4. B12 deficiency - B12    Meds: No orders of the defined types were placed in this encounter.   Follow-up: No follow-ups on file.  SHERIE  MURPHY, CMA 10/12/2023 10:21 AM

## 2023-10-13 LAB — LIPID PANEL
Chol/HDL Ratio: 2.8 ratio (ref 0.0–4.4)
Cholesterol, Total: 266 mg/dL — ABNORMAL HIGH (ref 100–199)
HDL: 94 mg/dL (ref >39)
LDL Chol Calc (NIH): 153 mg/dL — ABNORMAL HIGH (ref 0–99)
Triglycerides: 110 mg/dL (ref 0–149)
VLDL Cholesterol Cal: 19 mg/dL (ref 5–40)

## 2023-10-13 LAB — HEMOGLOBIN A1C
Est. average glucose Bld gHb Est-mCnc: 108 mg/dL
Hgb A1c MFr Bld: 5.4 % (ref 4.8–5.6)

## 2023-10-13 LAB — VITAMIN B12: Vitamin B-12: 446 pg/mL (ref 232–1245)

## 2023-10-14 ENCOUNTER — Ambulatory Visit (HOSPITAL_BASED_OUTPATIENT_CLINIC_OR_DEPARTMENT_OTHER): Payer: Self-pay | Admitting: Obstetrics & Gynecology

## 2023-10-18 LAB — CYTOLOGY - PAP
Comment: NEGATIVE
Diagnosis: UNDETERMINED — AB
High risk HPV: NEGATIVE

## 2023-10-20 ENCOUNTER — Ambulatory Visit: Payer: Self-pay

## 2023-10-20 NOTE — Telephone Encounter (Signed)
 FYI Only or Action Required?: FYI only for provider  Patient was last seen in primary care on < 3 years ago. Patient now has to re-establish care. New patient appt made with Wilhelmena Hanson NP @ [facility] at [facility]. Called Nurse Triage reporting High BP with no hx. Symptoms began a week ago. Interventions attempted: Nothing. Symptoms are: stable.  Triage Disposition: See Physician Within 24 Hours  Patient/caregiver understands and will follow disposition?: Yes     Copied from CRM [id number]. Topic: Clinical - Red Word Triage >> Oct 20, 2023  8:05 AM [name] wrote: Red Word that prompted transfer to Nurse Triage: Blood pressure reading as 180-109, has gotten higher over the last 3 days. Reason for Disposition  Systolic BP  >= 180 OR Diastolic >= 110  Answer Assessment - Initial Assessment Questions 1. BLOOD PRESSURE: "What is the blood pressure?" "Did you take at least two measurements 5 minutes apart?"     180/109 this morning 155/80 while at doctor last week 2. ONSET: "When did you take your blood pressure?"     First realized it was high last week at annual pap smear 3. HOW: "How did you take your blood pressure?" (e.g., automatic home BP monitor, visiting nurse)     Automatic cuff on upper arm 4. HISTORY: "Do you have a history of high blood pressure?"     No 5. MEDICINES: "Are you taking any medicines for blood pressure?" "Have you missed any doses recently?"     No meds for high blood pressure 6. OTHER SYMPTOMS: "Do you have any symptoms?" (e.g., blurred vision, chest pain, difficulty breathing, headache, weakness)     No  Protocols used: Blood Pressure - High-A-AH

## 2023-10-21 ENCOUNTER — Encounter: Payer: Self-pay | Admitting: Family Medicine

## 2023-10-21 ENCOUNTER — Ambulatory Visit (INDEPENDENT_AMBULATORY_CARE_PROVIDER_SITE_OTHER): Payer: PRIVATE HEALTH INSURANCE | Admitting: Family Medicine

## 2023-10-21 VITALS — BP 166/91 | HR 69 | Temp 97.7°F | Ht 63.0 in | Wt 132.0 lb

## 2023-10-21 DIAGNOSIS — Z7689 Persons encountering health services in other specified circumstances: Secondary | ICD-10-CM | POA: Diagnosis not present

## 2023-10-21 DIAGNOSIS — I10 Essential (primary) hypertension: Secondary | ICD-10-CM

## 2023-10-21 MED ORDER — LISINOPRIL 5 MG PO TABS: 5.0000 mg | ORAL_TABLET | Freq: Every day | ORAL | 3 refills | Status: DC

## 2023-10-21 NOTE — Patient Instructions (Signed)

## 2023-10-21 NOTE — Progress Notes (Signed)
 New Patient Office Visit  Subjective   Patient ID: Lauren Bell, female    DOB: 1960/09/21  Age: 63 y.o. MRN: [id number]  CC:  Chief Complaint  Patient presents with   Establish Care   Hypertension    HPI Lauren Bell is a 63 year old female who presents to establish with [facility] at [facility].   CC: Patient here to establish care  Last PCP:  Specialists: PMHx: fibroids, GERD, HLD, rheumatic fever   HYPERTENSION: [name] presents for the medical management of hypertension.  Patient's current hypertension medication regimen is: none  Patient is currently taking prescribed medications for HTN.  Patient is regularly keeping a check on BP at home. 130/70s was normally  180/109, 170/100s, 160/90s, 168/93, recent OB/GYN visit 155/78 Adhering to low sodium diet: no, she does eat not eat processed meat but does eat a lot of homemade soup  Exercising Regularly: 3x per week walking or paddling  Participates in yoga  Denies headache, dizziness, CP, SHOB, vision changes.    The 10-year ASCVD risk score (Arnett DK, et al., 2019) is: 9.1%   Values used to calculate the score:     Age: 89 years     Sex: Female     Is Non-Hispanic African American: No     Diabetic: No     Tobacco smoker: No     Systolic Blood Pressure: 166 mmHg     Is BP treated: Yes     HDL Cholesterol: 94 mg/dL     Total Cholesterol: 266 mg/dL  BP Readings from Last 3 Encounters:  10/21/23 (!) 166/91  10/12/23 (!) 155/78  09/09/22 115/77   Outpatient Encounter Medications as of 10/21/2023  Medication Sig   Calcium-Vitamin D -Vitamin K (CHEWABLE CALCIUM PO) Take by mouth.   cyanocobalamin  (VITAMIN B12) 1000 MCG/ML injection INJECT 1 ML (1,000 MCG TOTAL) INTO THE MUSCLE EVERY 30 DAYS.   estradiol  (ESTRACE ) 0.1 MG/GM vaginal cream 1 gram vaginally twice weekly   ibuprofen  (MOTRIN  IB) 200 MG tablet Take 1 tablet (200 mg total) by mouth every 6 (six) hours as needed for pain.    lisinopril  (ZESTRIL ) 5 MG tablet Take 1 tablet (5 mg total) by mouth daily.   nitrofurantoin , macrocrystal-monohydrate, (MACROBID ) 100 MG capsule 1 po as needed for UTI prevention   Syringe, Disposable, 3 ML MISC 1 Syringe by Does not apply route every 7 (seven) days.   triamcinolone  ointment (KENALOG ) 0.5 % Apply 1 Application topically 2 (two) times daily. Use bid for 7-10 days.  Then can use as needed.   No facility-administered encounter medications on file as of 10/21/2023.    Patient Active Problem List   Diagnosis Date Noted   Educated about COVID-19 virus infection 01/25/2019   Hyperlipidemia LDL goal <160 12/12/2014   Vitamin D  deficiency 12/12/2014   Routine general medical examination at a health care facility 07/29/2012   Dietary B12 deficiency 07/27/2012   Past Medical History:  Diagnosis Date   Fibroids    h/o    GERD (gastroesophageal reflux disease)    currently not an issue   Hyperlipidemia    Rheumatic fever    History reviewed. No pertinent surgical history. Family History  Problem Relation Age of Onset   Cancer Mother        Adrenal   Alcohol abuse Father    Hyperlipidemia Father    Hypertension Father    Heart disease Maternal Grandfather    Hypertension Maternal Grandfather  Diabetes Brother    Breast cancer Neg Hx    Social History   Socioeconomic History   Marital status: Married    Spouse name: Not on file   Number of children: Not on file   Years of education: Not on file   Highest education level: Bachelor's degree (e.g., BA, AB, BS)  Occupational History   Not on file  Tobacco Use   Smoking status: Never   Smokeless tobacco: Never  Vaping Use   Vaping status: Never Used  Substance and Sexual Activity   Alcohol use: No   Drug use: No   Sexual activity: Yes    Partners: Male    Birth control/protection: Post-menopausal, None  Other Topics Concern   Not on file  Social History Narrative   Not on file   Social Drivers of Health    Financial Resource Strain: Patient Declined (10/21/2023)   Overall Financial Resource Strain (CARDIA)    Difficulty of Paying Living Expenses: Patient declined  Food Insecurity: Patient Declined (10/21/2023)   Hunger Vital Sign    Worried About Running Out of Food in the Last Year: Patient declined    Ran Out of Food in the Last Year: Patient declined  Transportation Needs: No Transportation Needs (10/21/2023)   PRAPARE - Administrator, Civil Service (Medical): No    Lack of Transportation (Non-Medical): No  Physical Activity: Insufficiently Active (10/21/2023)   Exercise Vital Sign    Days of Exercise per Week: 3 days    Minutes of Exercise per Session: 40 min  Stress: No Stress Concern Present (10/21/2023)   [employer] of Occupational Health - Occupational Stress Questionnaire    Feeling of Stress : Only a little  Social Connections: Unknown (10/21/2023)   Social Connection and Isolation Panel [NHANES]    Frequency of Communication with Friends and Family: Patient declined    Frequency of Social Gatherings with Friends and Family: Patient declined    Attends Religious Services: Patient declined    Database administrator or Organizations: Patient declined    Attends Engineer, structural: Not on file    Marital Status: Married  Catering manager Violence: Not on file   Outpatient Medications Prior to Visit  Medication Sig Dispense Refill   Calcium-Vitamin D -Vitamin K (CHEWABLE CALCIUM PO) Take by mouth.     cyanocobalamin  (VITAMIN B12) 1000 MCG/ML injection INJECT 1 ML (1,000 MCG TOTAL) INTO THE MUSCLE EVERY 30 DAYS. 3 mL 4   estradiol  (ESTRACE ) 0.1 MG/GM vaginal cream 1 gram vaginally twice weekly 42.5 g 3   ibuprofen  (MOTRIN  IB) 200 MG tablet Take 1 tablet (200 mg total) by mouth every 6 (six) hours as needed for pain. 300 tablet 5   nitrofurantoin , macrocrystal-monohydrate, (MACROBID ) 100 MG capsule 1 po as needed for UTI prevention 30 capsule 1   Syringe,  Disposable, 3 ML MISC 1 Syringe by Does not apply route every 7 (seven) days. 100 each 2   triamcinolone  ointment (KENALOG ) 0.5 % Apply 1 Application topically 2 (two) times daily. Use bid for 7-10 days.  Then can use as needed. 30 g 1   No facility-administered medications prior to visit.   No Known Allergies  ROS: see HPI    Objective   Today's Vitals   10/21/23 1105 10/21/23 1156  BP: (!) 165/97 (!) 166/91  Pulse: 79 69  Temp: 97.7 F (36.5 C)   TempSrc: Oral   Weight: 132 lb (59.9 kg)   Height: 5\' 3"  (  1.6 m)    GENERAL: Well-appearing, in NAD. Well nourished.  SKIN: Pink, warm and dry. No rash, lesion, ulceration, or ecchymoses.  Head: Normocephalic. NECK: Trachea midline. Full ROM w/o pain or tenderness. No lymphadenopathy.  EARS: Tympanic membranes are intact, translucent without bulging and without drainage. Appropriate landmarks visualized.  EYES: Conjunctiva clear without exudates. EOMI, PERRL, no drainage present.  NOSE: Septum midline w/o deformity. Nares patent, mucosa pink and non-inflamed w/o drainage. No sinus tenderness.  THROAT: Uvula midline. Oropharynx clear. Tonsils non-inflamed without exudate. Mucous membranes pink and moist.  RESPIRATORY: Chest wall symmetrical. Respirations even and non-labored. Breath sounds clear to auscultation bilaterally.  CARDIAC: S1, S2 present, regular rate and rhythm without murmur or gallops. Peripheral pulses 2+ bilaterally.  MSK: Muscle tone and strength appropriate for age. Joints w/o tenderness, redness, or swelling.  EXTREMITIES: Without clubbing, cyanosis, or edema.  NEUROLOGIC: No motor or sensory deficits. Steady, even gait. C2-C12 intact.  PSYCH/MENTAL STATUS: Alert, oriented x 3. Cooperative, appropriate mood and affect.    Assessment & Plan:   1. Encounter to establish care (Primary) Patient is a 22- year-old female who presents today to establish care with primary care at [facility]. Reviewed the past medical  history, family history, social history, surgical history, medications and allergies today- updates made as indicated. Patient has concerns today about recent elevated blood pressure readings.    2. Primary hypertension Patient presents today with elevated blood pressure 165/97, repeat reading about the same 166/91. Patient in no acute distress and is well-appearing. Denies chest pain, shortness of breath, lower extremity edema, vision changes, headaches. Cardiovascular exam with heart regular rate and rhythm. Normal heart sounds, no murmurs present. No lower extremity edema present. Lungs clear to auscultation bilaterally. Patient is not currently taking any medications. Discussed first-line medication is ACEIs- including risks associated with them. Will obtain CMP prior to starting medication. Advised patient to closely monitor blood pressure and return to office sooner if blood pressure begins to increase greater than 130/80. Follow-up in 4 weeks.    - lisinopril  (ZESTRIL ) 5 MG tablet; Take 1 tablet (5 mg total) by mouth daily.  Dispense: 90 tablet; Refill: 3 - EKG 12-Lead - Comprehensive metabolic panel with GFR   Return in about 4 weeks (around 11/18/2023) for HTN follow-up.   Wilhelmena Hanson, FNP

## 2023-10-22 LAB — COMPREHENSIVE METABOLIC PANEL WITH GFR
ALT: 12 IU/L (ref 0–32)
AST: 18 IU/L (ref 0–40)
Albumin: 4.7 g/dL (ref 3.9–4.9)
Alkaline Phosphatase: 95 IU/L (ref 44–121)
BUN/Creatinine Ratio: 18 (ref 12–28)
BUN: 13 mg/dL (ref 8–27)
Bilirubin Total: 0.5 mg/dL (ref 0.0–1.2)
CO2: 23 mmol/L (ref 20–29)
Calcium: 9.5 mg/dL (ref 8.7–10.3)
Chloride: 104 mmol/L (ref 96–106)
Creatinine, Ser: 0.74 mg/dL (ref 0.57–1.00)
Globulin, Total: 1.8 g/dL (ref 1.5–4.5)
Glucose: 98 mg/dL (ref 70–99)
Potassium: 4.5 mmol/L (ref 3.5–5.2)
Sodium: 142 mmol/L (ref 134–144)
Total Protein: 6.5 g/dL (ref 6.0–8.5)
eGFR: 91 mL/min/{1.73_m2} (ref >59)

## 2023-10-25 ENCOUNTER — Ambulatory Visit: Payer: Self-pay | Admitting: Family Medicine

## 2023-10-28 ENCOUNTER — Other Ambulatory Visit: Payer: Self-pay | Admitting: Family Medicine

## 2023-10-28 DIAGNOSIS — I10 Essential (primary) hypertension: Secondary | ICD-10-CM

## 2023-10-28 MED ORDER — LISINOPRIL 5 MG PO TABS: 5.0000 mg | ORAL_TABLET | Freq: Every day | ORAL | 3 refills | Status: AC

## 2023-11-01 ENCOUNTER — Ambulatory Visit (HOSPITAL_BASED_OUTPATIENT_CLINIC_OR_DEPARTMENT_OTHER): Payer: Self-pay | Admitting: Obstetrics & Gynecology

## 2023-11-24 ENCOUNTER — Ambulatory Visit: Payer: PRIVATE HEALTH INSURANCE | Admitting: Family Medicine

## 2024-02-29 ENCOUNTER — Encounter (HOSPITAL_BASED_OUTPATIENT_CLINIC_OR_DEPARTMENT_OTHER): Payer: Self-pay | Admitting: Obstetrics & Gynecology

## 2024-02-29 ENCOUNTER — Other Ambulatory Visit (HOSPITAL_BASED_OUTPATIENT_CLINIC_OR_DEPARTMENT_OTHER): Payer: Self-pay

## 2024-02-29 DIAGNOSIS — Z01419 Encounter for gynecological examination (general) (routine) without abnormal findings: Secondary | ICD-10-CM

## 2024-03-13 ENCOUNTER — Encounter (HOSPITAL_BASED_OUTPATIENT_CLINIC_OR_DEPARTMENT_OTHER): Payer: Self-pay | Admitting: Obstetrics & Gynecology

## 2024-10-18 ENCOUNTER — Ambulatory Visit (HOSPITAL_BASED_OUTPATIENT_CLINIC_OR_DEPARTMENT_OTHER): Payer: PRIVATE HEALTH INSURANCE | Admitting: Obstetrics & Gynecology
